# Patient Record
Sex: Male | Born: 1985 | Race: White | Hispanic: No | Marital: Married | Smoking: Former smoker
Health system: Southern US, Community
[De-identification: ages and names within clinical notes are randomized; demographics above are authoritative.]

## PROBLEM LIST (undated history)

## (undated) DIAGNOSIS — K219 Gastro-esophageal reflux disease without esophagitis: Secondary | ICD-10-CM

## (undated) DIAGNOSIS — E669 Obesity, unspecified: Secondary | ICD-10-CM

## (undated) DIAGNOSIS — J36 Peritonsillar abscess: Secondary | ICD-10-CM

## (undated) HISTORY — DX: Obesity, unspecified: E66.9

## (undated) HISTORY — DX: Peritonsillar abscess: J36

## (undated) HISTORY — DX: Gastro-esophageal reflux disease without esophagitis: K21.9

---

## 2012-08-26 ENCOUNTER — Emergency Department: Payer: Self-pay | Admitting: Emergency Medicine

## 2012-08-26 LAB — COMPREHENSIVE METABOLIC PANEL
Albumin: 3.5 g/dL (ref 3.4–5.0)
Alkaline Phosphatase: 93 U/L (ref 50–136)
Anion Gap: 7 (ref 7–16)
BUN: 15 mg/dL (ref 7–18)
Bilirubin,Total: 0.3 mg/dL (ref 0.2–1.0)
Calcium, Total: 8.4 mg/dL — ABNORMAL LOW (ref 8.5–10.1)
Co2: 26 mmol/L (ref 21–32)
Creatinine: 1.03 mg/dL (ref 0.60–1.30)
EGFR (African American): >60
EGFR (Non-African Amer.): >60
Osmolality: 284 (ref 275–301)
Potassium: 3.9 mmol/L (ref 3.5–5.1)
SGOT(AST): 24 U/L (ref 15–37)
SGPT (ALT): 24 U/L (ref 12–78)
Total Protein: 7.5 g/dL (ref 6.4–8.2)

## 2012-08-26 LAB — CBC
HCT: 43.3 %
HGB: 15 g/dL
MCH: 28.5 pg
MCHC: 34.6 g/dL
MCV: 82 fL
Platelet: 188 x10 3/mm 3
RBC: 5.26 x10 6/mm 3
RDW: 13.8 %
WBC: 8.1 x10 3/mm 3

## 2013-02-03 DIAGNOSIS — Z720 Tobacco use: Secondary | ICD-10-CM | POA: Insufficient documentation

## 2014-04-19 ENCOUNTER — Ambulatory Visit: Payer: Self-pay | Admitting: Physician Assistant

## 2014-08-31 ENCOUNTER — Ambulatory Visit
Admission: EM | Admit: 2014-08-31 | Discharge: 2014-08-31 | Disposition: A | Discharge status: Home / Self Care | Payer: BLUE CROSS/BLUE SHIELD | Attending: Internal Medicine | Admitting: Internal Medicine

## 2014-08-31 DIAGNOSIS — H109 Unspecified conjunctivitis: Secondary | ICD-10-CM | POA: Diagnosis not present

## 2014-08-31 MED ORDER — ERYTHROMYCIN 5 MG/GM OP OINT: TOPICAL_OINTMENT | OPHTHALMIC | Status: DC

## 2014-08-31 MED ORDER — OLOPATADINE HCL 0.1 % OP SOLN: 1.0000 [drp] | Freq: Two times a day (BID) | OPHTHALMIC | Status: DC

## 2014-08-31 NOTE — ED Notes (Signed)
Complaints right eye redness since Friday, itchy with foriegn body sensation, discharge, and crusting. Visual Acuity: L 20/40, R 20/30, Both 20/30.

## 2014-08-31 NOTE — ED Provider Notes (Addendum)
CSN: [id number]     Arrival date & time 08/31/14  1612 History   First MD Initiated Contact with Patient 08/31/14 1711     Chief Complaint  Patient presents with  . Conjunctivitis  HPI  Patient is a 29 year old gentleman with no significant past medical history. He presents with a three-day history of foreign body sensation, redness, irritation, in the right eye. No loss of vision. Minimal crusting around the eye in the mornings. No runny/congested nose, not coughing. Some headache. Not waking in the night with pain in the eye. No fever.   No past medical history on file. No past surgical history on file. No family history on file. History  Substance Use Topics  . Smoking status: Never Smoker   . Smokeless tobacco: Not on file  . Alcohol Use: No    Review of Systems  All other systems reviewed and are negative.   Allergies  Review of patient's allergies indicates no known allergies.  Home Medications  Pt takes no meds regularly    BP 127/77 mmHg  Pulse 88  Temp(Src) 97.5 F (36.4 C) (Tympanic)  Resp 16  SpO2 96% Physical Exam  Constitutional: He is oriented to person, place, and time. No distress.  Alert, nicely groomed  HENT:  Head: Atraumatic.  Eyes:  Right eye conjunctiva is moderately injected, right upper lid is mildly erythematous, slightly puffy, no pointing. Right upper lid is everted, and no foreign body is visualized. No corneal irregularity is appreciated on penlight exam, nor with fluorescein exam.   Pupils are equal round and react to light. EOMI. No hypopyon  Neck: Neck supple.  Cardiovascular: Normal rate.   Pulmonary/Chest: No respiratory distress.  Abdominal: Soft. He exhibits no distension.  Musculoskeletal: Normal range of motion.  Neurological: He is alert and oriented to person, place, and time.  Skin: Skin is warm and dry.  No cyanosis  Nursing note and vitals reviewed.    Visual Acuity  Right Eye Distance: 20/40 Left Eye  Distance: 20/30 Bilateral Distance: 20/30    ED Course  Procedures  None at the urgent care today. MDM   1. Conjunctivitis, right eye    Prescription for olopatadine eyedrops, erythromycin eye ointment. Follow-up with eye doctor if not improving in 1-2 days. Office number for Candice CampCynthia Donnelly was given, [facility].    Eustace MooreLaura W Tanor Glaspy, MD 09/02/14 [phone]  Eustace MooreLaura W Evvie Behrmann, MD 09/02/14 [phone]

## 2014-08-31 NOTE — Discharge Instructions (Signed)
If not improving in 1-2 days (redness, irritation) have eye rechecked with eye doctor to rule out other causes of a red eye.  Dr Reece Leaderonnelly at [facility] [place] in [place].  Conjunctivitis Conjunctivitis is commonly called "pink eye." Conjunctivitis can be caused by bacterial or viral infection, allergies, or injuries. There is usually redness of the lining of the eye, itching, discomfort, and sometimes discharge. There may be deposits of matter along the eyelids. A viral infection usually causes a watery discharge, while a bacterial infection causes a yellowish, thick discharge. Pink eye is very contagious and spreads by direct contact. You may be given antibiotic eyedrops as part of your treatment. Before using your eye medicine, remove all drainage from the eye by washing gently with warm water and cotton balls. Continue to use the medication until you have awakened 2 mornings in a row without discharge from the eye. Do not rub your eye. This increases the irritation and helps spread infection. Use separate towels from other household members. Wash your hands with soap and water before and after touching your eyes. Use cold compresses to reduce pain and sunglasses to relieve irritation from light. Do not wear contact lenses or wear eye makeup until the infection is gone. SEEK MEDICAL CARE IF:   Your symptoms are not better after 3 days of treatment.  You have increased pain or trouble seeing.  The outer eyelids become very red or swollen. Document Released: 03/16/2004 Document Revised: 05/01/2011 Document Reviewed: 02/06/2005 [facility] Patient Information 2015 [place]. This information is not intended to replace advice given to you by your health care provider. Make sure you discuss any questions you have with your health care provider.

## 2014-12-31 ENCOUNTER — Ambulatory Visit
Admission: EM | Admit: 2014-12-31 | Discharge: 2014-12-31 | Disposition: A | Discharge status: Home / Self Care | Payer: BLUE CROSS/BLUE SHIELD | Attending: Family Medicine | Admitting: Family Medicine

## 2014-12-31 ENCOUNTER — Encounter: Payer: Self-pay | Admitting: Emergency Medicine

## 2014-12-31 DIAGNOSIS — J029 Acute pharyngitis, unspecified: Secondary | ICD-10-CM

## 2014-12-31 DIAGNOSIS — J020 Streptococcal pharyngitis: Secondary | ICD-10-CM | POA: Diagnosis not present

## 2014-12-31 LAB — RAPID STREP SCREEN (MED CTR MEBANE ONLY): STREPTOCOCCUS, GROUP A SCREEN (DIRECT): POSITIVE — AB

## 2014-12-31 MED ORDER — PENICILLIN G BENZATHINE 1200000 UNIT/2ML IM SUSP
1.2000 10*6.[IU] | Freq: Once | INTRAMUSCULAR | Status: AC
  Administered 2014-12-31: 1.2 10*6.[IU] via INTRAMUSCULAR

## 2014-12-31 NOTE — ED Provider Notes (Signed)
CSN: [id number]     Arrival date & time 12/31/14  [id number] History   First MD Initiated Contact with Patient 12/31/14 0920    Nurses notes were reviewed. Chief Complaint  Patient presents with  . Sore Throat   patient with sore throat for several days. States she's had about 3-4 episodes of strep this year. Because of throat pain worse not better and came in to be seen and evaluated. States it was Injection really Bicillin seemed to help the best.   (Consider location/radiation/quality/duration/timing/severity/associated sxs/prior Treatment) Patient is a 29 y.o. male presenting with pharyngitis. The history is provided by the patient. No language interpreter was used.  Sore Throat This is a new problem. The current episode started more than 2 days ago. The problem occurs constantly. The problem has been gradually worsening. Pertinent negatives include no chest pain, no abdominal pain and no headaches. Nothing relieves the symptoms. He has tried nothing for the symptoms. The treatment provided no relief.    History reviewed. No pertinent past medical history. History reviewed. No pertinent past surgical history. History reviewed. No pertinent family history. Social History  Substance Use Topics  . Smoking status: Current Every Day Smoker -- 1.00 packs/day    Types: Cigarettes  . Smokeless tobacco: None  . Alcohol Use: None    Review of Systems  Cardiovascular: Negative for chest pain.  Gastrointestinal: Negative for abdominal pain.  Neurological: Negative for headaches.  All other systems reviewed and are negative.   Allergies  Review of patient's allergies indicates no known allergies.  Home Medications   Prior to Admission medications   Medication Sig Start Date End Date Taking? Authorizing Provider  erythromycin ophthalmic ointment Place a 1/2 inch ribbon of ointment into the lower eyelid. 08/31/14   Eustace MooreLaura W Murray, MD  olopatadine (PATANOL) 0.1 % ophthalmic solution Place 1  drop into the right eye 2 (two) times daily. 08/31/14   Eustace MooreLaura W Murray, MD   Meds Ordered and Administered this Visit   Medications  penicillin g benzathine (BICILLIN LA) 1200000 UNIT/2ML injection 1.2 Million Units (not administered)    BP 124/80 mmHg  Pulse 103  Temp(Src) 99.9 F (37.7 C) (Tympanic)  Resp 20  Ht 6' (1.829 m)  Wt 302 lb (136.986 kg)  BMI 40.95 kg/m2  SpO2 97% No data found.   Physical Exam  Constitutional: He is oriented to person, place, and time. He appears well-developed and well-nourished.  Obese white male  Eyes: Pupils are equal, round, and reactive to light.  Neck: Normal range of motion. Neck supple. No tracheal deviation present.  Musculoskeletal: Normal range of motion.  Lymphadenopathy:    He has cervical adenopathy.  Neurological: He is alert and oriented to person, place, and time. No cranial nerve deficit.  Skin: Skin is dry. No erythema.  Psychiatric: He has a normal mood and affect.  Vitals reviewed.   ED Course  Procedures (including critical care time)  Labs Review Labs Reviewed  RAPID STREP SCREEN (NOT AT [facility]) - Abnormal; Notable for the following:    Streptococcus, Group A Screen (Direct) POSITIVE (*)    All other components within normal limits    Imaging Review No results found.   Visual Acuity Review  Right Eye Distance:   Left Eye Distance:   Bilateral Distance:    Right Eye Near:   Left Eye Near:    Bilateral Near:         MDM   1. Strep pharyngitis   2.  Pharyngitis    Patient will be given injection of LA Bicillin 1.2 million units. Recommend follow-up in 2 weeks either here or with his PCP for repeat strep test for procedure care. Work note written for today and tomorrow.    Hassan Rowan, MD 12/31/14 [phone]

## 2014-12-31 NOTE — ED Notes (Signed)
Started with sore throat on Tuesday, is progressively getting worse, ears hurt as well

## 2014-12-31 NOTE — Discharge Instructions (Signed)
Strep Throat Strep throat is an infection of the throat. It is caused by germs. Strep throat spreads from person to person because of coughing, sneezing, or close contact. HOME CARE Medicines  Take over-the-counter and prescription medicines only as told by your doctor.  Take your antibiotic medicine as told by your doctor. Do not stop taking the medicine even if you feel better.  Have family members who also have a sore throat or fever go to a doctor. Eating and Drinking  Do not share food, drinking cups, or personal items.  Try eating soft foods until your sore throat feels better.  Drink enough fluid to keep your pee (urine) clear or pale yellow. General Instructions  Rinse your mouth (gargle) with a salt-water mixture 3-4 times per day or as needed. To make a salt-water mixture, stir -1 tsp of salt into 1 cup of warm water.  Make sure that all people in your house wash their hands well.  Rest.  Stay home from school or work until you have been taking antibiotics for 24 hours.  Keep all follow-up visits as told by your doctor. This is important. GET HELP IF:  Your neck keeps getting bigger.  You get a rash, cough, or earache.  You cough up thick liquid that is green, yellow-brown, or bloody.  You have pain that does not get better with medicine.  Your problems get worse instead of getting better.  You have a fever. GET HELP RIGHT AWAY IF:  You throw up (vomit).  You get a very bad headache.  You neck hurts or it feels stiff.  You have chest pain or you are short of breath.  You have drooling, very bad throat pain, or changes in your voice.  Your neck is swollen or the skin gets red and tender.  Your mouth is dry or you are peeing less than normal.  You keep feeling more tired or it is hard to wake up.  Your joints are red or they hurt.   This information is not intended to replace advice given to you by your health care provider. Make sure you  discuss any questions you have with your health care provider.   Document Released: 07/26/2007 Document Revised: 10/28/2014 Document Reviewed: 06/01/2014 Elsevier Interactive Patient Education 2016 Elsevier Inc.  Upper Respiratory Infection, Adult Most upper respiratory infections (URIs) are caused by a virus. A URI affects the nose, throat, and upper air passages. The most common type of URI is often called "the common cold." HOME CARE   Take medicines only as told by your doctor.  Gargle warm saltwater or take cough drops to comfort your throat as told by your doctor.  Use a warm mist humidifier or inhale steam from a shower to increase air moisture. This may make it easier to breathe.  Drink enough fluid to keep your pee (urine) clear or pale yellow.  Eat soups and other clear broths.  Have a healthy diet.  Rest as needed.  Go back to work when your fever is gone or your doctor says it is okay.  You may need to stay home longer to avoid giving your URI to others.  You can also wear a face mask and wash your hands often to prevent spread of the virus.  Use your inhaler more if you have asthma.  Do not use any tobacco products, including cigarettes, chewing tobacco, or electronic cigarettes. If you need help quitting, ask your doctor. GET HELP IF:  You are  getting worse, not better.  Your symptoms are not helped by medicine.  You have chills.  You are getting more short of breath.  You have brown or red mucus.  You have yellow or brown discharge from your nose.  You have pain in your face, especially when you bend forward.  You have a fever.  You have puffy (swollen) neck glands.  You have pain while swallowing.  You have white areas in the back of your throat. GET HELP RIGHT AWAY IF:   You have very bad or constant:  Headache.  Ear pain.  Pain in your forehead, behind your eyes, and over your cheekbones (sinus pain).  Chest pain.  You have  long-lasting (chronic) lung disease and any of the following:  Wheezing.  Long-lasting cough.  Coughing up blood.  A change in your usual mucus.  You have a stiff neck.  You have changes in your:  Vision.  Hearing.  Thinking.  Mood. MAKE SURE YOU:   Understand these instructions.  Will watch your condition.  Will get help right away if you are not doing well or get worse.   This information is not intended to replace advice given to you by your health care provider. Make sure you discuss any questions you have with your health care provider.   Document Released: 07/26/2007 Document Revised: 06/23/2014 Document Reviewed: 05/14/2013 Elsevier Interactive Patient Education [employer] Elsevier Inc.

## 2015-01-10 ENCOUNTER — Emergency Department
Admission: EM | Admit: 2015-01-10 | Discharge: 2015-01-11 | Disposition: A | Discharge status: Home / Self Care | Payer: BLUE CROSS/BLUE SHIELD | Attending: Emergency Medicine | Admitting: Emergency Medicine

## 2015-01-10 ENCOUNTER — Encounter: Payer: Self-pay | Admitting: Emergency Medicine

## 2015-01-10 DIAGNOSIS — J36 Peritonsillar abscess: Secondary | ICD-10-CM

## 2015-01-10 DIAGNOSIS — Z792 Long term (current) use of antibiotics: Secondary | ICD-10-CM | POA: Diagnosis not present

## 2015-01-10 DIAGNOSIS — H9201 Otalgia, right ear: Secondary | ICD-10-CM | POA: Diagnosis not present

## 2015-01-10 DIAGNOSIS — F17210 Nicotine dependence, cigarettes, uncomplicated: Secondary | ICD-10-CM | POA: Diagnosis not present

## 2015-01-10 DIAGNOSIS — J029 Acute pharyngitis, unspecified: Secondary | ICD-10-CM | POA: Diagnosis present

## 2015-01-10 HISTORY — DX: Peritonsillar abscess: J36

## 2015-01-10 MED ORDER — BENZOCAINE 20 % MT SOLN
Freq: Once | OROMUCOSAL | Status: AC
  Administered 2015-01-10: 1 via OROMUCOSAL
  Filled 2015-01-10: qty 10

## 2015-01-10 MED ORDER — CLINDAMYCIN HCL 150 MG PO CAPS
300.0000 mg | ORAL_CAPSULE | Freq: Once | ORAL | Status: AC
  Administered 2015-01-11: 300 mg via ORAL
  Filled 2015-01-10: qty 2

## 2015-01-10 MED ORDER — DEXAMETHASONE SODIUM PHOSPHATE 10 MG/ML IJ SOLN
10.0000 mg | Freq: Once | INTRAMUSCULAR | Status: AC
  Administered 2015-01-10: 10 mg via INTRAVENOUS
  Filled 2015-01-10: qty 1

## 2015-01-10 MED ORDER — OXYCODONE-ACETAMINOPHEN 5-325 MG PO TABS: 1.0000 | ORAL_TABLET | Freq: Four times a day (QID) | ORAL | Status: DC | PRN

## 2015-01-10 MED ORDER — CLINDAMYCIN HCL 300 MG PO CAPS: 300.0000 mg | ORAL_CAPSULE | Freq: Three times a day (TID) | ORAL | Status: DC

## 2015-01-10 MED ORDER — MORPHINE SULFATE (PF) 2 MG/ML IV SOLN
2.0000 mg | Freq: Once | INTRAVENOUS | Status: AC
  Administered 2015-01-10: 2 mg via INTRAVENOUS
  Filled 2015-01-10: qty 1

## 2015-01-10 MED ORDER — OXYCODONE-ACETAMINOPHEN 5-325 MG PO TABS
2.0000 | ORAL_TABLET | Freq: Once | ORAL | Status: AC
  Administered 2015-01-11: 2 via ORAL
  Filled 2015-01-10: qty 2

## 2015-01-10 MED ORDER — LIDOCAINE-EPINEPHRINE 1 %-1:200000 IJ SOLN
INTRAMUSCULAR | Status: AC
Start: 2015-01-10 — End: 2015-01-10
  Administered 2015-01-10: 30 mL
  Filled 2015-01-10: qty 30

## 2015-01-10 MED ORDER — LIDOCAINE-EPINEPHRINE (PF) 2 %-1:200000 IJ SOLN
10.0000 mL | Freq: Once | INTRAMUSCULAR | Status: AC
  Filled 2015-01-10: qty 10

## 2015-01-10 NOTE — Consult Note (Signed)
..   Howard Bauer, Howard Bauer [id number] 1985-06-02 [name]  Reason for Consult: Peritonsillar abscess  HPI: 29 y.o. Male with a ten day history of strep throat and pain.  Seen initially and given PCN injection.  Continued to have pain which has persisted and worsened on his right side.  Began having pain tolerating his secretions and right sided neck and ear pain as well.  Presented to ED and found to have probable right sided peritonsillar abscess.  Patient reports 2 or 3 prior strep throat infections this past year.  Patient denies breathing difficulty.  Allergies: No Known Allergies  ROS: Review of systems normal other than 12 systems except per HPI.  PMH: History reviewed. No pertinent past medical history.  FH: No family history on file.  SH:  Social History   Social History  . Marital Status: Married    Spouse Name: N/A  . Number of Children: N/A  . Years of Education: N/A   Occupational History  . Not on file.   Social History Main Topics  . Smoking status: Current Every Day Smoker -- 1.00 packs/day    Types: Cigarettes  . Smokeless tobacco: Not on file  . Alcohol Use: No  . Drug Use: No  . Sexual Activity: Not on file   Other Topics Concern  . Not on file   Social History Narrative    PSH: History reviewed. No pertinent past surgical history.  Physical  Exam: GEN-  CN 2-12 grossly intact and symmetric. EARS- external ears normal BL OC/OP-  Significant right tonsillar swelling with purulence and exudate, uvular deviation to left side EXT- Skin warm and dry NOSE- Nasal cavity without polyps or purulence. External nose without masses or lesions NECK- neck supple with no masses or lesions. Bilateral lymphadenopathy palpated. Thyroid normal with no masses.  Procedure-  Drainage of right peritonsillar abscess with ~10cc's purulent material removed.  Please see separate operative report for details   A/P: Peritonsillar abscess s/p drainage  Plan:  IV steroids and  abx.  Follow up early this week if no significant improvement.  If patient is able to tolerate PO, he is cleared for discharge.   Howard Bauer 01/10/2015 11:48 PM

## 2015-01-10 NOTE — ED Provider Notes (Signed)
[facility] Emergency Department Provider Note  Time seen: 10:55 PM  I have reviewed the triage vital signs and the nursing notes.   HISTORY  Chief Complaint Sore Throat and Otalgia    HPI Howard Bauer is a 29 y.o. male with no past medical history who presents the emergency department with throat pain. According to the patient he was diagnosed with strep throat on 12/31/14 and dosed Bicillin. He states since that time his sore throat has never completely resolved but over the past 3-4 days it has worsened significantly with significant pain with swallowing. Denies fever, nausea, vomiting. Patient is able to swallow but with  pain.     History reviewed. No pertinent past medical history.  There are no active problems to display for this patient.   History reviewed. No pertinent past surgical history.  Current Outpatient Rx  Name  Route  Sig  Dispense  Refill  . erythromycin ophthalmic ointment      Place a 1/2 inch ribbon of ointment into the lower eyelid.   3.5 g   0   . olopatadine (PATANOL) 0.1 % ophthalmic solution   Right Eye   Place 1 drop into the right eye 2 (two) times daily.   5 mL   0     Allergies Review of patient's allergies indicates no known allergies.  No family history on file.  Social History Social History  Substance Use Topics  . Smoking status: Current Every Day Smoker -- 1.00 packs/day    Types: Cigarettes  . Smokeless tobacco: None  . Alcohol Use: No    Review of Systems Constitutional: Negative for fever. ENT:  Positive sore throat Cardiovascular: Negative for chest pain. Respiratory: Negative for shortness of breath. Gastrointestinal: Negative for abdominal pain Genitourinary: Negative for dysuria. Musculoskeletal: Negative for back pain. Neurological: Negative for headache 10-point ROS otherwise negative.  ____________________________________________   PHYSICAL EXAM:  VITAL SIGNS: ED  Triage Vitals  Enc Vitals Group     BP 01/10/15 2150 130/85 mmHg     Pulse Rate 01/10/15 2150 84     Resp 01/10/15 2150 18     Temp 01/10/15 2150 98.2 F (36.8 C)     Temp Source 01/10/15 2150 Oral     SpO2 01/10/15 2150 94 %     Weight 01/10/15 2150 295 lb (133.811 kg)     Height 01/10/15 2150 6' (1.829 m)     Head Cir --      Peak Flow --      Pain Score 01/10/15 2151 10     Pain Loc --      Pain Edu? --      Excl. in GC? --     Constitutional: Alert and oriented. Well appearing and in no distress. Eyes: Normal exam ENT   Head: Normocephalic and atraumatic   Mouth/Throat: Mucous membranes are moist. significant swelling of the right tonsil, with leftward uvular deviation most consistent with a right peritonsillar abscess. Exudate present bilaterally, more so on the right tonsil. Cardiovascular: Normal rate, regular rhythm. No murmur Respiratory: Normal respiratory effort without tachypnea nor retractions. Breath sounds are clear  Gastrointestinal: Soft and nontender. No distention.  Musculoskeletal: Nontender with normal range of motion in all extremities. Neurologic:  Normal speech and language. No gross focal neurologic deficits Skin:  Skin is warm, dry and intact.  Psychiatric: Mood and affect are normal. Speech and behavior are normal.   ____________________________________________   INITIAL IMPRESSION / ASSESSMENT AND  PLAN / ED COURSE  Pertinent labs & imaging results that were available during my care of the patient were reviewed by me and considered in my medical decision making (see chart for details).  Patient presents with sore throat, exam most consistent with a right peritonsillar abscess. I discussed the patient with ENT, Dr. Andee Poles is currently in the emergency department evaluating the patient.  Dr. Andee Poles has drained the peritonsillar abscess in the emergency department. We'll discharge home on Percocet, clindamycin. Patient was dose morphine and  Decadron in the emergency department. ____________________________________________   FINAL CLINICAL IMPRESSION(S) / ED DIAGNOSES   right peritonsillar abscess   Minna Antis, MD 01/10/15 2326

## 2015-01-10 NOTE — Op Note (Signed)
..  01/10/2015 11:54 PM    Gentry Fitzapps, Alex[place]ander  [id number]   Pre-Op Dx:  Right Peritonsillar Abscess  Post-op Dx: same  Proc:  I&D Right sided PTA  Surg:  Valda Christenson  Anes:  Local  EBL:  <10  Comp:  none  Findings:  Large right sided abscess cavity opened and drained  Procedure: With the patient in a comfortable sitting position, local Hurricane was administered in standard fashion.  After this, 2cc's of 1% Lidocaine with 1:100,000 Epi was injected into the surrounding tissues of his right tonsil and given several minutes to take effect.  At this time, an 1418 gauge seeker needle was placed on a 10cc syringe and inserted just lateral and superior to the right tonsil.  Approximately 10ccs of purulence was removed.  At this time, an 11 blade scalpel was used to make a small stab incision into the abscess pocket.  Kelly clamp was used to spread this incision open and enter the abscess cavity.  Purulent and bloody secretions were suctioned.  A significant reduction in size of the right tonsillar swelling was noted.   At this time, the procedure was complete and the patient was transferred to care of the ER in good condition.    Dispo:   Home  Plan:  Hydration, antibiosis, analgesia   Gauri Galvao 01/10/2015 11:54 PM

## 2015-01-10 NOTE — Discharge Instructions (Signed)
Peritonsillar Abscess °A peritonsillar abscess is a collection of yellowish-white fluid (pus) in the back of the throat behind the tonsils. It usually occurs when an infection of the throat or tonsils (tonsillitis) spreads into the tissues around the tonsils. °CAUSES °The infection that leads to a peritonsillar abscess is usually caused by streptococcal bacteria.  °SIGNS AND SYMPTOMS °· Sore throat, often with pain on just one side. °· Swelling and tenderness of the glands (lymph nodes) in the neck. °· Difficulty swallowing. °· Difficulty opening your mouth. °· Fever. °· Chills. °· Drooling because of difficulty swallowing saliva. °· Headache. °· Changes in your voice. °· Bad breath. °DIAGNOSIS °Your health care provider will take your medical history and do a physical exam. Imaging tests may be done, such as an ultrasound or CT scan. A sample of pus may be removed from the abscess using a needle (needle aspiration) or by swabbing the back of your throat. This sample will be sent to a lab for testing. °TREATMENT °Treatment usually involves draining the pus from the abscess. This may be done through needle aspiration or by making an incision in the abscess. You will also likely need to take antibiotic medicine. °HOME CARE INSTRUCTIONS °· Rest as much as possible and get plenty of sleep. °· Take medicines only as directed by your health care provider. °· If you were prescribed an antibiotic medicine, finish it all even if you start to feel better. °· If your abscess was drained by your health care provider, gargle with a mixture of salt and warm water: °¨ Mix 1 tsp of salt in 8 oz of warm water. °¨ Gargle with this mixture four times per day or as needed for comfort. °¨ Do not swallow this mixture. °· Drink plenty of fluids. °· While your throat is sore, eat soft or liquid foods, such as frozen ice pops and ice cream. °· Keep all follow-up visits as directed by your health care provider. This is important. °SEEK  MEDICAL CARE IF: °· You have increased pain, swelling, redness, or drainage in your throat. °· You develop a headache, a lack of energy (lethargy), or generalized feelings of illness. °· You have a fever. °· You feel dizzy. °· You have difficulty swallowing or eating. °· You show signs of becoming dehydrated, such as: °¨ Light-headedness when standing. °¨ Decreased urine output. °¨ A fast heart rate. °¨ Dry mouth. °SEEK IMMEDIATE MEDICAL CARE IF:  °· You have difficulty talking or breathing, or you find it easier to breathe when you lean forward. °· You are coughing up blood or vomiting blood. °· You have severe throat pain that is not helped by medicines. °· You start to drool. °  °This information is not intended to replace advice given to you by your health care provider. Make sure you discuss any questions you have with your health care provider. °  °Document Released: 02/06/2005 Document Revised: 02/27/2014 Document Reviewed: 09/22/2013 °Elsevier Interactive Patient Education ©2016 Elsevier Inc. ° °

## 2015-01-10 NOTE — ED Notes (Signed)
Pt presents to ED with c/o sore throat and right ear pain since 01/07/15. Pt reports was dx with strep throat Nov 10 and given a penicillin injection. Pt reports intermittent right ear pain with ringing at times. Pt c/o painful to swallow. Pt denies shortness of breath, abdominal pain, or chest pain. Pt alert and oriented x 4, no increased work in breathing noted, skin warm and dry. Family at bedside, call bell within reach.

## 2015-01-10 NOTE — ED Notes (Signed)
Pt c/o sore throat on the right side with right earache

## 2015-01-10 NOTE — ED Notes (Signed)
Dr. Vaught at bedside. 

## 2015-01-10 NOTE — ED Notes (Signed)
Pt complains of sore throat and pain to right ear that started on Thursday.

## 2015-01-11 NOTE — ED Notes (Signed)

## 2015-02-09 ENCOUNTER — Ambulatory Visit (INDEPENDENT_AMBULATORY_CARE_PROVIDER_SITE_OTHER): Payer: BLUE CROSS/BLUE SHIELD | Admitting: Family Medicine

## 2015-02-09 ENCOUNTER — Encounter: Payer: Self-pay | Admitting: Family Medicine

## 2015-02-09 VITALS — BP 129/84 | HR 85 | Temp 98.1°F | Resp 16 | Ht 72.0 in | Wt 303.0 lb

## 2015-02-09 DIAGNOSIS — Z8249 Family history of ischemic heart disease and other diseases of the circulatory system: Secondary | ICD-10-CM

## 2015-02-09 DIAGNOSIS — Z23 Encounter for immunization: Secondary | ICD-10-CM

## 2015-02-09 DIAGNOSIS — Z720 Tobacco use: Secondary | ICD-10-CM | POA: Diagnosis not present

## 2015-02-09 DIAGNOSIS — Z0000 Encounter for general adult medical examination without abnormal findings: Secondary | ICD-10-CM | POA: Diagnosis not present

## 2015-02-09 DIAGNOSIS — Z833 Family history of diabetes mellitus: Secondary | ICD-10-CM

## 2015-02-09 MED ORDER — BUPROPION HCL ER (SR) 150 MG PO TB12: 150.0000 mg | ORAL_TABLET | Freq: Two times a day (BID) | ORAL | Status: DC

## 2015-02-09 NOTE — Assessment & Plan Note (Signed)
Pt desiring to quit smoking. Discussed risks and benefits of Wellbutrin for smoking cessation. Pt has not contraindications.  Start buproprion SR BID for 12 weeks.  RTC 3 mos for smoking cessation.  >10 minutes tobacco counseling given at this visit.

## 2015-02-09 NOTE — Progress Notes (Signed)
Subjective:    Patient ID: Howard Bauer, male    DOB: 1986-02-16, 29 y.o.   MRN: [id number]  HPI: Howard Bauer is a 29 y.o. male presenting on 02/09/2015 for Establish Care   HPI  Pt presents to establish care today. Previous care provider was Dr. Yetta Barre, [facility]  It has been 5 years since His last PCP visit. Records from previous provider will be requested and reviewed. Current medical problems include:  Recent peritonsillar abcess. Treated 1 mos ago. Per patient doesn't need follow with ENT unless he gets strep throat again.   Health maintenance:  Last Tdap- 10 years ago. Would like updated today.  Works at [employer] x 10 years. Smoke exposure. Smoker- Tried chantix in the past- bad dreams. Is desiring to quit smoking.  Strong family history of heart disease and diabetes. Will screen today.       Past Medical History  Diagnosis Date  . Peritonsillar abscess . GERD (gastroesophageal reflux disease)     in past  . Obesity    Social History   Social History  . Marital Status: Married    Spouse Name: N/A  . Number of Children: N/A  . Years of Education: N/A   Occupational History  . Not on file.   Social History Main Topics  . Smoking status: Current Every Day Smoker -- 1.00 packs/day    Types: Cigarettes  . Smokeless tobacco: Not on file  . Alcohol Use: No  . Drug Use: No  . Sexual Activity: Not on file   Other Topics Concern  . Not on file   Social History Narrative   Family History  Problem Relation Age of Onset  . Heart disease Mother   . Cancer Mother     ovarian cancer  . Heart disease Father   . Stroke Father   . Diabetes Sister     type 1  . Cancer Maternal Grandmother     liver cancer   No current outpatient prescriptions on file prior to visit.   No current facility-administered medications on file prior to visit.    Review of Systems  Constitutional: Negative for fever and chills.  HENT:  Negative.   Respiratory: Negative for chest tightness, shortness of breath and wheezing.   Cardiovascular: Negative for chest pain, palpitations and leg swelling.  Gastrointestinal: Negative for nausea, vomiting and abdominal pain.  Endocrine: Negative.   Genitourinary: Negative for dysuria, urgency, discharge, penile pain and testicular pain.  Musculoskeletal: Negative for back pain, joint swelling and arthralgias.  Skin: Negative.   Neurological: Negative for dizziness, weakness, numbness and headaches.  Psychiatric/Behavioral: Negative for sleep disturbance and dysphoric mood.   Per HPI unless specifically indicated above     Objective:    BP 129/84 mmHg  Pulse 85  Temp(Src) 98.1 F (36.7 C) (Oral)  Resp 16  Ht 6' (1.829 m)  Wt 303 lb (137.44 kg)  BMI 41.09 kg/m2  Wt Readings from Last 3 Encounters:  02/09/15 303 lb (137.44 kg)  01/10/15 295 lb (133.811 kg)  12/31/14 302 lb (136.986 kg)    Physical Exam  Constitutional: He is oriented to person, place, and time. He appears well-developed and well-nourished. No distress.  HENT:  Head: Normocephalic and atraumatic.  Neck: Neck supple. No thyromegaly present.  Cardiovascular: Normal rate, regular rhythm and normal heart sounds.  Exam reveals no gallop and no friction rub.   No murmur heard. Pulmonary/Chest: Effort normal and breath  sounds normal. He has no wheezes.  Abdominal: Soft. Bowel sounds are normal. He exhibits no distension. There is no tenderness. There is no rebound.  Musculoskeletal: Normal range of motion. He exhibits no edema or tenderness.  Neurological: He is alert and oriented to person, place, and time. He has normal reflexes.  Skin: Skin is warm and dry. No rash noted. No erythema.  Psychiatric: He has a normal mood and affect. His behavior is normal. Thought content normal.   Results for orders placed or performed during the hospital encounter of 12/31/14  Rapid strep screen  Result Value Ref Range    Streptococcus, Group A Screen (Direct) POSITIVE (A) NEGATIVE      Assessment & Plan:   Problem List Items Addressed This Visit      Other   Current tobacco use    Pt desiring to quit smoking. Discussed risks and benefits of Wellbutrin for smoking cessation. Pt has not contraindications.  Start buproprion SR BID for 12 weeks.  RTC 3 mos for smoking cessation.  >10 minutes tobacco counseling given at this visit.       Relevant Medications   buPROPion (WELLBUTRIN SR) 150 MG 12 hr tablet    Other Visit Diagnoses    Preventative health care    -  Primary    Relevant Orders    Tdap vaccine greater than or equal to 7yo IM (Completed)    Family history of heart disease        Check lipid panel to stratify risk.     Relevant Orders    Lipid Profile    Family history of diabetes mellitus        Check CMP to stratify risk for DM.     Relevant Orders    Comprehensive Metabolic Panel (CMET)       Meds ordered this encounter  Medications  . buPROPion (WELLBUTRIN SR) 150 MG 12 hr tablet    Sig: Take 1 tablet (150 mg total) by mouth 2 (two) times daily.    Dispense:  60 tablet    Refill:  2    Order Specific Question:  Supervising Provider    Answer:  Janeann ForehandHAWKINS JR, JAMES H [[id number]]      Follow up plan: Return in about 3 months (around 05/10/2015) for Smoking. .Marland Kitchen

## 2015-02-09 NOTE — Patient Instructions (Addendum)
Start taking 150 SR of Wellbutrin at night x 7 days for smoking then increase to 150 twice daily in the second week.  We will try this for 12 weeks.   Bupropion sustained-release tablets (Depression/Mood Disorders) What is this medicine? BUPROPION (byoo PROE pee on) is used to treat depression. This medicine may be used for other purposes; ask your health care provider or pharmacist if you have questions. What should I tell my health care provider before I take this medicine? They need to know if you have any of these conditions: -an eating disorder, such as anorexia or bulimia -bipolar disorder or psychosis -diabetes or high blood sugar, treated with medication -glaucoma -head injury or brain tumor -heart disease, previous heart attack, or irregular heart beat -high blood pressure -kidney or liver disease -seizures -suicidal thoughts or a previous suicide attempt -Tourette's syndrome -weight loss -an unusual or allergic reaction to bupropion, other medicines, foods, dyes, or preservatives -breast-feeding -pregnant or trying to become pregnant How should I use this medicine? Take this medicine by mouth with a glass of water. Follow the directions on the prescription label. You can take it with or without food. If it upsets your stomach, take it with food. Do not cut, crush or chew this medicine. Take your medicine at regular intervals. If you take this medicine more than once a day, take your second dose at least 8 hours after you take your first dose. To limit difficulty in sleeping, avoid taking this medicine at bedtime. Do not take your medicine more often than directed. Do not stop taking this medicine suddenly except upon the advice of your doctor. Stopping this medicine too quickly may cause serious side effects or your condition may worsen. A special MedGuide will be given to you by the pharmacist with each prescription and refill. Be sure to read this information carefully each  time. Talk to your pediatrician regarding the use of this medicine in children. Special care may be needed. Overdosage: If you think you have taken too much of this medicine contact a poison control center or emergency room at once. NOTE: This medicine is only for you. Do not share this medicine with others. What if I miss a dose? If you miss a dose, skip the missed dose and take your next tablet at the regular time. There should be at least 8 hours between doses. Do not take double or extra doses. What may interact with this medicine? Do not take this medicine with any of the following medications: -linezolid -MAOIs like Azilect, Carbex, Eldepryl, Marplan, Nardil, and Parnate -methylene blue (injected into a vein) -other medicines that contain bupropion like Zyban This medicine may also interact with the following medications: -alcohol -certain medicines for anxiety or sleep -certain medicines for blood pressure like metoprolol, propranolol -certain medicines for depression or psychotic disturbances -certain medicines for HIV or AIDS like efavirenz, lopinavir, nelfinavir, ritonavir -certain medicines for irregular heart beat like propafenone, flecainide -certain medicines for Parkinson's disease like amantadine, levodopa -certain medicines for seizures like carbamazepine, phenytoin, phenobarbital -cimetidine -clopidogrel -cyclophosphamide -furazolidone -isoniazid -nicotine -orphenadrine -procarbazine -steroid medicines like prednisone or cortisone -stimulant medicines for attention disorders, weight loss, or to stay awake -tamoxifen -theophylline -thiotepa -ticlopidine -tramadol -warfarin This list may not describe all possible interactions. Give your health care provider a list of all the medicines, herbs, non-prescription drugs, or dietary supplements you use. Also tell them if you smoke, drink alcohol, or use illegal drugs. Some items may interact with your medicine. What  should I watch for while using this medicine? Tell your doctor if your symptoms do not get better or if they get worse. Visit your doctor or health care professional for regular checks on your progress. Because it may take several weeks to see the full effects of this medicine, it is important to continue your treatment as prescribed by your doctor. Patients and their families should watch out for new or worsening thoughts of suicide or depression. Also watch out for sudden changes in feelings such as feeling anxious, agitated, panicky, irritable, hostile, aggressive, impulsive, severely restless, overly excited and hyperactive, or not being able to sleep. If this happens, especially at the beginning of treatment or after a change in dose, call your health care professional. Avoid alcoholic drinks while taking this medicine. Drinking excessive alcoholic beverages, using sleeping or anxiety medicines, or quickly stopping the use of these agents while taking this medicine may increase your risk for a seizure. Do not drive or use heavy machinery until you know how this medicine affects you. This medicine can impair your ability to perform these tasks. Do not take this medicine close to bedtime. It may prevent you from sleeping. Your mouth may get dry. Chewing sugarless gum or sucking hard candy, and drinking plenty of water may help. Contact your doctor if the problem does not go away or is severe. What side effects may I notice from receiving this medicine? Side effects that you should report to your doctor or health care professional as soon as possible: -allergic reactions like skin rash, itching or hives, swelling of the face, lips, or tongue -breathing problems -changes in vision -confusion -fast or irregular heartbeat -hallucinations -increased blood pressure -redness, blistering, peeling or loosening of the skin, including inside the mouth -seizures -suicidal thoughts or other mood  changes -unusually weak or tired -vomiting Side effects that usually do not require medical attention (report to your doctor or health care professional if they continue or are bothersome): -change in sex drive or performance -constipation -headache -loss of appetite -nausea -tremors -weight loss This list may not describe all possible side effects. Call your doctor for medical advice about side effects. You may report side effects to FDA at 1-800-FDA-1088. Where should I keep my medicine? Keep out of the reach of children. Store at room temperature between 20 and 25 degrees C (68 and 77 degrees F), away from direct sunlight and moisture. Keep tightly closed. Throw away any unused medicine after the expiration date. NOTE: This sheet is a summary. It may not cover all possible information. If you have questions about this medicine, talk to your doctor, pharmacist, or health care provider.    2016, Elsevier/Gold Standard. (2012-08-30 12:41:10)

## 2015-05-14 ENCOUNTER — Ambulatory Visit: Payer: BLUE CROSS/BLUE SHIELD | Admitting: Family Medicine

## 2015-10-19 DIAGNOSIS — H5213 Myopia, bilateral: Secondary | ICD-10-CM | POA: Diagnosis not present

## 2016-05-30 ENCOUNTER — Ambulatory Visit (INDEPENDENT_AMBULATORY_CARE_PROVIDER_SITE_OTHER): Payer: BLUE CROSS/BLUE SHIELD | Admitting: Nurse Practitioner

## 2016-05-30 ENCOUNTER — Encounter: Payer: Self-pay | Admitting: Nurse Practitioner

## 2016-05-30 ENCOUNTER — Ambulatory Visit
Admission: RE | Admit: 2016-05-30 | Discharge: 2016-05-30 | Disposition: A | Discharge status: Home / Self Care | Payer: BLUE CROSS/BLUE SHIELD | Source: Ambulatory Visit | Attending: Nurse Practitioner | Admitting: Nurse Practitioner

## 2016-05-30 VITALS — BP 123/72 | HR 67 | Temp 98.3°F | Resp 16 | Ht 72.0 in | Wt 279.0 lb

## 2016-05-30 DIAGNOSIS — Z6837 Body mass index (BMI) 37.0-37.9, adult: Secondary | ICD-10-CM | POA: Diagnosis not present

## 2016-05-30 DIAGNOSIS — Z716 Tobacco abuse counseling: Secondary | ICD-10-CM | POA: Diagnosis not present

## 2016-05-30 DIAGNOSIS — G8929 Other chronic pain: Secondary | ICD-10-CM

## 2016-05-30 DIAGNOSIS — Z0000 Encounter for general adult medical examination without abnormal findings: Secondary | ICD-10-CM

## 2016-05-30 DIAGNOSIS — M1711 Unilateral primary osteoarthritis, right knee: Secondary | ICD-10-CM | POA: Diagnosis not present

## 2016-05-30 DIAGNOSIS — Z720 Tobacco use: Secondary | ICD-10-CM

## 2016-05-30 DIAGNOSIS — M25561 Pain in right knee: Secondary | ICD-10-CM | POA: Diagnosis present

## 2016-05-30 DIAGNOSIS — Z7689 Persons encountering health services in other specified circumstances: Secondary | ICD-10-CM | POA: Insufficient documentation

## 2016-05-30 LAB — COMPLETE METABOLIC PANEL WITH GFR
ALT: 11 U/L (ref 9–46)
AST: 13 U/L (ref 10–40)
Albumin: 4.3 g/dL (ref 3.6–5.1)
Alkaline Phosphatase: 69 U/L (ref 40–115)
BUN: 12 mg/dL (ref 7–25)
CO2: 24 mmol/L (ref 20–31)
Calcium: 9.1 mg/dL (ref 8.6–10.3)
Chloride: 104 mmol/L (ref 98–110)
Creat: 0.93 mg/dL (ref 0.60–1.35)
GFR, Est African American: >89 mL/min (ref >=60)
GFR, Est Non African American: >89 mL/min (ref >=60)
Glucose, Bld: 86 mg/dL (ref 65–99)
Potassium: 4.2 mmol/L (ref 3.5–5.3)
Sodium: 138 mmol/L (ref 135–146)
Total Bilirubin: 0.5 mg/dL (ref 0.2–1.2)
Total Protein: 7.3 g/dL (ref 6.1–8.1)

## 2016-05-30 LAB — LIPID PANEL
Cholesterol: 163 mg/dL (ref <200)
HDL: 32 mg/dL — ABNORMAL LOW (ref >40)
LDL Cholesterol: 107 mg/dL — ABNORMAL HIGH (ref <100)
Total CHOL/HDL Ratio: 5.1 Ratio — ABNORMAL HIGH (ref <5.0)
Triglycerides: 119 mg/dL (ref <150)
VLDL: 24 mg/dL (ref <30)

## 2016-05-30 MED ORDER — VARENICLINE TARTRATE 0.5 MG X 11 & 1 MG X 42 PO MISC: ORAL | 0 refills | Status: DC

## 2016-05-30 NOTE — Patient Instructions (Addendum)
Daleon, Thank you for coming in to clinic today.  1. For your annual physical, we are checking a one-time lipid panel to screen for high cholesterol.  We are also checking electrolytes, kidney, and liver function for baseline and to screen for glucose and diabetes.  2. For your weight loss:  Continue eating a healthier diet and reducing overall calories.  Continue an active lifestyle.  Work toward 1/2 to 1 lb per week and about 25 lbs in a year.  3. For your right knee pain: - We are starting with an X-Ray to evaluate the joint space.  If no joint space changes, I will suggest and order physical therapy to strengthen the muscles around your knee and improve the stability of your knee joint.  If there are joint space changes, I will place a referral to orthopedics for additional evaluation.  4. For smoking cessation, we are going to start chantix.  Set a quit date for 7 days after starting Chantix.  Start Varenicline (Chantix) - Day 1-3: Take one 0.5mg  tablet once daily WITH FOOD - Days 4-7: increase to one 0.5mg  tablet twice per day.  - Days 7- 12 weeks max: Increase to one  tablet twice per day for up to 12 weeks - Set quit date 1 week after start of medication.  If unable to quit completely, then set goal reduce by 50% or more by 4 weeks, then another 50% reduction in 4 more weeks, and lastly quit after final 4 weeks.  - We can continue Chantix for up to 12 weeks total if needed for maintenance. - Common side effects include nausea (take with food), headaches, insomnia, vivid dreams, mood instability, seizure, and a rare risk of suicidal ideation.  If you have any serious agitation or acute depression with severe mood changes you need to stop taking the medicine immediately  - Quit tips: choose something to occupy your hands that you will carry in your pocket.  It can be a rock or other object that can prevent your boredom.  Please schedule a follow-up appointment with Wilhelmina Mcardle,  AGNP in 1 month for smoking cessation follow up.   Schedule your next annual physical in 1 year.  If you have any other questions or concerns, please feel free to call the clinic or send a message through MyChart. You may also schedule an earlier appointment if necessary.  Wilhelmina Mcardle, DNP, AGNP-BC Adult Gerontology Nurse Practitioner [facility] [place], [facility]   Coping with Quitting Smoking Quitting smoking is a physical and mental challenge. You will face cravings, withdrawal symptoms, and temptation. Before quitting, work with your health care provider to make a plan that can help you cope. Preparation can help you quit and keep you from giving in. How can I cope with cravings? Cravings usually last for 5-10 minutes. If you get through it, the craving will pass. Consider taking the following actions to help you cope with cravings:  Keep your mouth busy:  Chew sugar-free gum.  Suck on hard candies or a straw.  Brush your teeth.  Keep your hands and body busy:  Immediately change to a different activity when you feel a craving.  Squeeze or play with a ball.  Do an activity or a hobby, like making bead jewelry, practicing needlepoint, or working with wood.  Mix up your normal routine.  Take a short exercise break. Go for a quick walk or run up and down stairs.  Spend time in public places where smoking  is not allowed.  Focus on doing something kind or helpful for someone else.  Call a friend or family member to talk during a craving.  Join a support group.  Call a quit line, such as 1-800-QUIT-NOW.  Talk with your health care provider about medicines that might help you cope with cravings and make quitting easier for you. How can I deal with withdrawal symptoms? Your body may experience negative effects as it tries to get used to not having nicotine in the system. These effects are called withdrawal symptoms. They may include:  Feeling hungrier than  normal.  Trouble concentrating.  Irritability.  Trouble sleeping.  Feeling depressed.  Restlessness and agitation.  Craving a cigarette. 1.  To manage withdrawal symptoms:  Avoid places, people, and activities that trigger your cravings.  Remember why you want to quit.  Get plenty of sleep.  Avoid coffee and other caffeinated drinks. These may worsen some of your symptoms. How can I handle social situations? Social situations can be difficult when you are quitting smoking, especially in the first few weeks. To manage this, you can:  Avoid parties, bars, and other social situations where people might be smoking.  Avoid alcohol.  Leave right away if you have the urge to smoke.  Explain to your family and friends that you are quitting smoking. Ask for understanding and support.  Plan activities with friends or family where smoking is not an option. What are some ways I can cope with stress? Wanting to smoke may cause stress, and stress can make you want to smoke. Find ways to manage your stress. Relaxation techniques can help. For example:  Breathe slowly and deeply, in through your nose and out through your mouth.  Listen to soothing, relaxing music.  Talk with a family member or friend about your stress.  Light a candle.  Soak in a bath or take a shower.  Think about a peaceful place. What are some ways I can prevent weight gain? Be aware that many people gain weight after they quit smoking. However, not everyone does. To keep from gaining weight, have a plan in place before you quit and stick to the plan after you quit. Your plan should include:  Having healthy snacks. When you have a craving, it may help to:  Eat plain popcorn, crunchy carrots, celery, or other cut vegetables.  Chew sugar-free gum.  Changing how you eat:  Eat small portion sizes at meals.  Eat 4-6 small meals throughout the day instead of 1-2 large meals a day.  Be mindful when you  eat. Do not watch television or do other things that might distract you as you eat.  Exercising regularly:  Make time to exercise each day. If you do not have time for a long workout, do short bouts of exercise for 5-10 minutes several times a day.  Do some form of strengthening exercise, like weight lifting, and some form of aerobic exercise, like running or swimming.  Drinking plenty of water or other low-calorie or no-calorie drinks. Drink 6-8 glasses of water daily, or as much as instructed by your health care provider. Summary  Quitting smoking is a physical and mental challenge. You will face cravings, withdrawal symptoms, and temptation to smoke again. Preparation can help you as you go through these challenges.  You can cope with cravings by keeping your mouth busy (such as by chewing gum), keeping your body and hands busy, and making calls to family, friends, or a helpline for  people who want to quit smoking.  You can cope with withdrawal symptoms by avoiding places where people smoke, avoiding drinks with caffeine, and getting plenty of rest.  Ask your health care provider about the different ways to prevent weight gain, avoid stress, and handle social situations. This information is not intended to replace advice given to you by your health care provider. Make sure you discuss any questions you have with your health care provider. Document Released: 02/04/2016 Document Revised: 02/04/2016 Document Reviewed: 02/04/2016 Elsevier Interactive Patient Education  2017 [employer].

## 2016-05-30 NOTE — Progress Notes (Signed)
Subjective:    Patient ID: Howard Bauer, male    DOB: [birth year], 31 y.o.   MRN: [id number]  Howard Bauer is a 31 y.o. male presenting on 05/30/2016 for Annual Exam   HPI   Patient states he feels well and has no major complaints.  Tobacco use/Smoking Cessation Patient currently smokes 1-1.5 ppd and has tried Chantix and had vivid dreams and welbutrin and felt drained.  He does desire to quit and is willing to try Chantix again.  The last time tried to quit was 6 months ago using welbutrin.  Welbutrin and chantix helped reduce cigarettes smoked per day, but he didn't quit completely.  He admits he smokes r/t stress and boredom.  Tried sunflower seeds in past but had mucosal membrane trauma.  Staying busy helps reduce cravings, but the worst time is driving.    Chronic, relapsing, remitting Right knee pain He had a R knee sprain in august of 2016 and now notices grinding and popping noises that are worse when he steps and feels bones shift side to side.  This occurs at random times and he can go for weeks without issues.  After experiencing knee re-injury he has swelling and pain.  Pt requests evaluation and agrees to X-ray.  BMI 37.84 Patient reports weight loss of 24 lbs since November 2016 with lifestyle changes to "eat better."  This includes less fried food.  The patient and his wife and pt are doing the healthy wage program together and are seeing success.  The biggest and best change was no soda.  Physical activity includes yardwork and stuff at home "off the couch."  Patient is fasting today and agrees to screening labwork. Past Medical History:  Diagnosis Date  . GERD (gastroesophageal reflux disease)    in past  . Obesity   . Peritonsillar abscess No past surgical history on file. Social History   Social History  . Marital status: Married    Spouse name: N/A  . Number of children: N/A  . Years of education: N/A   Occupational History    . Not on file.   Social History Main Topics  . Smoking status: Current Every Day Smoker    Packs/day: 1.25    Types: Cigarettes  . Smokeless tobacco: Current User     Comment: 1 to 1.5 packs  . Alcohol use No  . Drug use: No  . Sexual activity: Yes    Birth control/ protection: None     Comment: monogamous, partner with tubal ligation   Other Topics Concern  . Not on file   Social History Narrative  . No narrative on file   Family History  Problem Relation Age of Onset  . Heart disease Mother   . Cancer Mother     ovarian cancer  . Heart disease Father   . Stroke Father   . Diabetes Sister     type 1  . Cancer Maternal Grandmother     liver cancer   No current outpatient prescriptions on file prior to visit.   No current facility-administered medications on file prior to visit.     Review of Systems  All other systems reviewed and are negative.  Per HPI unless specifically indicated above      Objective:    BP 123/72   Pulse 67   Temp 98.3 F (36.8 C) (Oral)   Resp 16   Ht 6' (1.829 m)   Wt 279  lb (126.6 kg)   BMI 37.84 kg/m  Waist 45 Wt Readings from Last 3 Encounters:  05/30/16 279 lb (126.6 kg)  02/09/15 (!) 303 lb (137.4 kg)  01/10/15 295 lb (133.8 kg)    Physical Exam  Constitutional: He is oriented to person, place, and time. He appears well-developed and well-nourished.  obese  HENT:  Head: Normocephalic and atraumatic.  Right Ear: Tympanic membrane, external ear and ear canal normal.  Left Ear: Tympanic membrane, external ear and ear canal normal.  Nose: Nose normal.  Mouth/Throat: Oropharynx is clear and moist.  Eyes: Conjunctivae and EOM are normal. Pupils are equal, round, and reactive to light.  Neck: Normal range of motion. Neck supple. No thyromegaly present.  Cardiovascular: Normal rate, regular rhythm, normal heart sounds and intact distal pulses.   Pulmonary/Chest: Effort normal and breath sounds normal.  Abdominal: Soft.  Bowel sounds are normal. He exhibits no distension. There is no tenderness.  Musculoskeletal: Normal range of motion.       Right knee: Normal.  Varus position when standing  Lymphadenopathy:    He has no cervical adenopathy.  Neurological: He is alert and oriented to person, place, and time.  Skin: Skin is warm and dry.  Psychiatric: He has a normal mood and affect. His behavior is normal. He expresses no homicidal and no suicidal ideation. He expresses no suicidal plans and no homicidal plans.   DG Knee Complete 4 Views Right CLINICAL DATA:  Pain and swelling  EXAM: RIGHT KNEE - COMPLETE 4+ VIEW  COMPARISON:  None.  FINDINGS: Standing frontal, standing oblique, prone tunnel, lateral, and sunrise patellar images obtained. There is no fracture or dislocation. No joint effusion. There is moderate joint space narrowing medially. There is spurring medially. Other joint compartments appear unremarkable. No erosive change.  IMPRESSION: Osteoarthritic change medially.  No fracture or joint effusion.  Electronically Signed   By: Bretta Bang III M.D.   On: 05/30/2016 10:34      Assessment & Plan:  1. Annual physical exam Health maintenance reviewed and updated. HIV screening declined. Obesity indicates screening for hyperlipidemia and diabetes. - Lipid panel - COMPLETE METABOLIC PANEL WITH GFR  2. Chronic pain of Right knee Episodic pain exacerbated by injury/instability.  Remote history of sprain followed by joint laxity.  Exam normal today. Plan: - Plan in-office 4-view X-ray, which reveals osteoarthritis, bone spurs and medial thinning of joint space. - Manage with anti-inflamatory medications.  - Start taking Tylenol extra strength 1 to 2 tablets every 6-8 hours for aches as needed. Do not take more than 3,000 mg in 24 hours from all medicines.   - May take Ibuprofen 200-400mg  every 8 hours as needed.  - Use heat and ice. Apply this for 15 minutes at a time up  to 6-8 times per day.   - May also use muscle rub with lidocaine, but avoid using with heat and ice to avoid burns.  - Encouraged further weight loss for overall joint health. - PT referral placed for strengthening.  Proceed with orthopedic referral if indicated after PT trial.  3. Body mass index (BMI) of 37.0 to 37.9 in adult Improving with 24 lb weight loss in 1 year.  - Celebrated current weight loss and encouraged continued weight loss. Continue and iIncrease physical activity.  Continue adding vegetables to diet and reducing fried foods. - Screen for hyperlipidemia r/t at risk with obesity. - Lipid panel  4. Current tobacco use 5. Encounter for smoking cessation counseling  Patient ready to quit.    - Start varenicline (Chantix) Day 1-3: Take one 0.5mg  tablet once daily with food; Days 4-7: increase to one 0.5mg  tablet twice per day; Days 7- 12 weeks max: Increase to one  tablet twice per day for up to 12 weeks. - Advised to set quit date 1 week after start of medication.  If unable to quit completely, reduce by 50%. - Consider continuation of Chantix for up to 12 weeks total if needed for maintenance. - Advised about common side effects and possibility for depression and SI.  If experiencing depression or SI, STOP medicine.  - varenicline (CHANTIX STARTING MONTH PAK) 0.5 MG X 11 & 1 MG X 42 tablet; Take one 0.5 mg tablet by mouth once daily for 3 days, then one 0.5 mg tablet twice daily for 4 days, then one 1 mg tablet twice daily.  Dispense: 53 tablet; Refill: 0    Meds ordered this encounter  Medications  . varenicline (CHANTIX STARTING MONTH PAK) 0.5 MG X 11 & 1 MG X 42 tablet    Sig: Take one 0.5 mg tablet by mouth once daily for 3 days, then one 0.5 mg tablet twice daily for 4 days, then one 1 mg tablet twice daily.    Dispense:  53 tablet    Refill:  0    Order Specific Question:   Supervising Provider    Answer:   Smitty Cords [[id number]]     Discussion  today >10 minutes specifically on counseling on risks of tobacco use, complications, treatment, smoking cessation.  Start Chantix for 1 month and can continue for up to 12 weeks.   Follow up plan: Return in about 1 month (around 06/29/2016) for 1 month smoking cessation & 1 year - annual physical or as needed.  Wilhelmina Mcardle, DNP, AGPCNP-BC Adult Gerontology Primary Care Nurse Practitioner [facility] 05/30/2016, 9:51 AM

## 2016-06-01 NOTE — Progress Notes (Signed)
I have reviewed this encounter including the documentation in this note and/or discussed this patient with the provider, Wilhelmina Mcardle, AGPCNP-BC. I am certifying that I agree with the content of this note as supervising physician.  Saralyn Pilar, DO [facility] 06/01/2016, 8:11 AM

## 2016-06-27 ENCOUNTER — Encounter: Payer: Self-pay | Admitting: Nurse Practitioner

## 2016-06-27 ENCOUNTER — Ambulatory Visit (INDEPENDENT_AMBULATORY_CARE_PROVIDER_SITE_OTHER): Payer: BLUE CROSS/BLUE SHIELD | Admitting: Nurse Practitioner

## 2016-06-27 VITALS — BP 123/68 | HR 78 | Temp 98.2°F | Resp 16 | Ht 72.0 in | Wt 285.2 lb

## 2016-06-27 DIAGNOSIS — Z716 Tobacco abuse counseling: Secondary | ICD-10-CM

## 2016-06-27 DIAGNOSIS — Z720 Tobacco use: Secondary | ICD-10-CM

## 2016-06-27 MED ORDER — VARENICLINE TARTRATE 1 MG PO TABS: 1.0000 mg | ORAL_TABLET | Freq: Two times a day (BID) | ORAL | 1 refills | Status: DC

## 2016-06-27 NOTE — Progress Notes (Signed)
Subjective:    Patient ID: Howard Bauer, male    DOB: 05-Aug-1985, 31 y.o.   MRN: [id number]  Howard Bauer is a 31 y.o. male presenting on 06/27/2016 for No chief complaint on file.   HPI Smoking Cessation Zuri has been taking Chantix for 4 weeks.  He has been able to quit smoking completely for the last 7 days.  He is having no cravings unless around others who are smoking. He is noticing that he is eating more.  He has gained 6 lbs since his last visit.  He reports no significant nausea, depression or suicidal ideation. He is noticing being "easily aggravated" and only 1-2 vivid dreams.  He has used Chantix in the past with significant vivid dreams, but notes this is much better than before.   He does desire to continue Chantix for at least 4 more weeks and possibly up to a total of 12 weeks.  Social History  Substance Use Topics  . Smoking status: Current Every Day Smoker    Packs/day: 1.25    Types: Cigarettes  . Smokeless tobacco: Current User     Comment: 1 to 1.5 packs  . Alcohol use No    Review of Systems  Constitutional: Positive for appetite change.  Respiratory: Negative.   Cardiovascular: Negative.   Gastrointestinal: Negative.   Neurological: Negative.   Psychiatric/Behavioral: Negative.  Negative for self-injury, sleep disturbance and suicidal ideas.   Per HPI unless specifically indicated above     Objective:    There were no vitals taken for this visit.  Wt Readings from Last 3 Encounters:  05/30/16 279 lb (126.6 kg)  02/09/15 (!) 303 lb (137.4 kg)  01/10/15 295 lb (133.8 kg)    Physical Exam  Constitutional: He is oriented to person, place, and time. He appears well-developed and well-nourished. No distress.  HENT:  Head: Normocephalic and atraumatic.  Neck: Normal range of motion. Neck supple. No tracheal deviation present.  Cardiovascular: Normal rate, regular rhythm and normal heart sounds.   Pulmonary/Chest: Effort  normal and breath sounds normal. No respiratory distress. He has no wheezes. He has no rales.  Lymphadenopathy:    He has no cervical adenopathy.  Neurological: He is alert and oriented to person, place, and time.  Skin: Skin is warm and dry.  Psychiatric: He has a normal mood and affect. His behavior is normal.         Assessment & Plan:   Problem List Items Addressed This Visit    None    Visit Diagnoses    Encounter for smoking cessation counseling    -  Primary Patient has quit smoking for 1 week on Chantix. Desires to continue Chantix for now.  Tolerating well without significant side effects, importantly no depression or SI.  Plan: 1. Continue Chantix for up to 8 more weeks. 2. Return to clinic in 2 months or sooner for re-evaluation of smoking cessation or need for continuing therapy to stay quit.   Discussion today >10 minutes specifically on counseling on risks of tobacco use, complications, treatment, smoking cessation. Discussed side effects of Chantix, ongoing use of Chantix, tips to stay quit and recommendations for curbing his weight gain.    Relevant Medications   varenicline (CHANTIX CONTINUING MONTH PAK) 1 MG tablet      Meds ordered this encounter  Medications  . varenicline (CHANTIX CONTINUING MONTH PAK) 1 MG tablet    Sig: Take 1 tablet (1 mg total) by mouth 2 (two)  times daily.    Dispense:  30 tablet    Refill:  1    Order Specific Question:   Supervising Provider    Answer:   Smitty CordsKARAMALEGOS, ALEXANDER J [[id number]]     Follow up plan: Return in about 2 months (around 08/27/2016) for smoking cessation.    Wilhelmina McardleLauren Jelani Vreeland, DNP, AGPCNP-BC Adult Gerontology Primary Care Nurse Practitioner [facility] 06/27/2016, 9:13 AM

## 2016-06-27 NOTE — Patient Instructions (Signed)
Howard Bauer, Thank you for coming in to clinic today.  1. For your smoking cessation: - GREAT WORK! - Continue your chantix.  Please schedule a follow-up appointment with Howard Bauer [name], AGNP to Return in about 2 months (around 08/27/2016) for smoking cessation.  Can be cancelled if not needed.  If you have any other questions or concerns, please feel free to call the clinic or send a message through MyChart. You may also schedule an earlier appointment if necessary.  Howard Bauer Noelle Sease, DNP, AGNP-BC Adult Gerontology Nurse Practitioner [facility], [facility]

## 2016-06-28 NOTE — Progress Notes (Signed)
I have reviewed this encounter including the documentation in this note and/or discussed this patient with the provider, Wilhelmina McardleLauren Kennedy, AGPCNP-BC. I am certifying that I agree with the content of this note as supervising physician.  Saralyn PilarAlexander Karamalegos, DO [facility] 06/28/2016, 9:20 PM

## 2016-09-05 ENCOUNTER — Ambulatory Visit: Payer: BLUE CROSS/BLUE SHIELD | Admitting: Nurse Practitioner

## 2017-12-17 ENCOUNTER — Other Ambulatory Visit: Payer: Self-pay

## 2017-12-17 ENCOUNTER — Ambulatory Visit (INDEPENDENT_AMBULATORY_CARE_PROVIDER_SITE_OTHER): Payer: BLUE CROSS/BLUE SHIELD | Admitting: Nurse Practitioner

## 2017-12-17 ENCOUNTER — Encounter: Payer: Self-pay | Admitting: Nurse Practitioner

## 2017-12-17 VITALS — BP 118/77 | HR 101 | Temp 98.8°F | Resp 17 | Ht 72.0 in | Wt 295.8 lb

## 2017-12-17 DIAGNOSIS — Z716 Tobacco abuse counseling: Secondary | ICD-10-CM | POA: Diagnosis not present

## 2017-12-17 DIAGNOSIS — Z23 Encounter for immunization: Secondary | ICD-10-CM | POA: Diagnosis not present

## 2017-12-17 DIAGNOSIS — Z0000 Encounter for general adult medical examination without abnormal findings: Secondary | ICD-10-CM

## 2017-12-17 MED ORDER — VARENICLINE TARTRATE 0.5 MG X 11 & 1 MG X 42 PO MISC: ORAL | 0 refills | Status: DC

## 2017-12-17 MED ORDER — VARENICLINE TARTRATE 1 MG PO TABS: 1.0000 mg | ORAL_TABLET | Freq: Two times a day (BID) | ORAL | 0 refills | Status: DC

## 2017-12-17 NOTE — Progress Notes (Signed)
Subjective:    Patient ID: Howard Bauer, male    DOB: April 24, 1985, 32 y.o.   MRN: [id number]  Howard Bauer is a 32 y.o. male presenting on 12/17/2017 for Annual Exam (pt recently )   HPI Annual Physical Exam Patient has been feeling fairly well.  They have acute concerns today regarding recent grief.  Sleeps 5-6 hours per night with about one interruption per night.  Difficulty falling asleep, wakes up, never feels rested in am.  Death of a close coworker whom he found unresponsive 1 year ago and 1 week ago his father died.  HEALTH MAINTENANCE: Weight/BMI: 290-265 in 2-3 months and stopped diet.  Now only 10 lbs higher. Physical activity: walking some at work, no structure Diet: poor vegetables Seatbelt: always Sunscreen: rarely HIV: Declines screening Optometry: regular around every 2-3 years Dentistry: not regular  VACCINES: Tetanus: 2016 Influenza: desires today  Past Medical History:  Diagnosis Date  . GERD (gastroesophageal reflux disease)    in past  . Obesity   . Peritonsillar abscess 11 20 16    No past surgical history on file. Social History   Socioeconomic History  . Marital status: Married    Spouse name: Not on file  . Number of children: Not on file  . Years of education: Not on file  . Highest education level: Not on file  Occupational History  . Not on file  Social Needs  . Financial resource strain: Not on file  . Food insecurity:    Worry: Not on file    Inability: Not on file  . Transportation needs:    Medical: Not on file    Non-medical: Not on file  Tobacco Use  . Smoking status: Current Every Day Smoker    Packs/day: 1.00    Types: Cigarettes    Last attempt to quit: 06/20/2016    Years since quitting: 1.4  . Smokeless tobacco: Former Engineer, water and Sexual Activity  . Alcohol use: No  . Drug use: No  . Sexual activity: Yes    Birth control/protection: None    Comment: monogamous, partner with tubal  ligation  Lifestyle  . Physical activity:    Days per week: Not on file    Minutes per session: Not on file  . Stress: Not on file  Relationships  . Social connections:    Talks on phone: Not on file    Gets together: Not on file    Attends religious service: Not on file    Active member of club or organization: Not on file    Attends meetings of clubs or organizations: Not on file    Relationship status: Not on file  . Intimate partner violence:    Fear of current or ex partner: Not on file    Emotionally abused: Not on file    Physically abused: Not on file    Forced sexual activity: Not on file  Other Topics Concern  . Not on file  Social History Narrative  . Not on file   Family History  Problem Relation Age of Onset  . Heart disease Mother   . Cancer Mother        ovarian cancer  . Heart disease Father   . Stroke Father   . Diabetes Sister        type 1  . Cancer Maternal Grandmother        liver cancer   No current outpatient medications on file prior to visit.  No current facility-administered medications on file prior to visit.     Review of Systems  Constitutional: Negative for activity change, appetite change, fatigue and unexpected weight change.  HENT: Negative for congestion, hearing loss and trouble swallowing.   Eyes: Negative for visual disturbance.  Respiratory: Negative for choking, shortness of breath and wheezing.   Cardiovascular: Negative for chest pain and palpitations.  Gastrointestinal: Negative for abdominal pain, blood in stool, constipation and diarrhea.  Genitourinary: Negative for difficulty urinating, discharge, flank pain, genital sores, penile pain, penile swelling, scrotal swelling and testicular pain.  Musculoskeletal: Negative for arthralgias, back pain and myalgias.  Skin: Negative for color change, rash and wound.  Allergic/Immunologic: Negative for environmental allergies.  Neurological: Negative for dizziness, seizures,  weakness and headaches.  Psychiatric/Behavioral: Positive for sleep disturbance. Negative for behavioral problems, decreased concentration, dysphoric mood and suicidal ideas. The patient is not nervous/anxious.    Per HPI unless specifically indicated above     Objective:    BP 118/77   Pulse (!) 101   Temp 98.8 F (37.1 C) (Oral)   Resp 17   Ht 6' (1.829 m)   Wt 295 lb 12.8 oz (134.2 kg)   BMI 40.12 kg/m   Wt Readings from Last 3 Encounters:  12/17/17 295 lb 12.8 oz (134.2 kg)  06/27/16 285 lb 3.2 oz (129.4 kg)  05/30/16 279 lb (126.6 kg)    Physical Exam  Constitutional: He is oriented to person, place, and time. He appears well-developed and well-nourished. No distress.  Morbidly obese with central adiposity  HENT:  Head: Normocephalic and atraumatic.  Right Ear: External ear normal.  Left Ear: External ear normal.  Nose: Nose normal.  Mouth/Throat: Oropharynx is clear and moist.  Eyes: Pupils are equal, round, and reactive to light. Conjunctivae are normal.  Neck: Normal range of motion. Neck supple. No JVD present. No tracheal deviation present. No thyromegaly present.  Cardiovascular: Normal rate, regular rhythm, normal heart sounds and intact distal pulses. Exam reveals no gallop and no friction rub.  No murmur heard. Pulmonary/Chest: Effort normal and breath sounds normal. No respiratory distress.  Abdominal: Soft. Bowel sounds are normal. He exhibits no distension. There is no hepatosplenomegaly. There is no tenderness.  Musculoskeletal: Normal range of motion.  Lymphadenopathy:    He has no cervical adenopathy.  Neurological: He is alert and oriented to person, place, and time. No cranial nerve deficit.  Skin: Skin is warm and dry.  Psychiatric: His speech is normal and behavior is normal. Judgment and thought content normal. Cognition and memory are normal. He exhibits a depressed mood (occasionally tearful when discussing deaths of friend and father).  Nursing  note and vitals reviewed.    Results for orders placed or performed in visit on 05/30/16  Lipid panel  Result Value Ref Range   Cholesterol 163 <200 mg/dL   Triglycerides 629 <528 mg/dL   HDL 32 (L) >41 mg/dL   Total CHOL/HDL Ratio 5.1 (H) <5.0 Ratio   VLDL 24 <30 mg/dL   LDL Cholesterol 324 (H) <100 mg/dL  COMPLETE METABOLIC PANEL WITH GFR  Result Value Ref Range   Sodium 138 135 - 146 mmol/L   Potassium 4.2 3.5 - 5.3 mmol/L   Chloride 104 98 - 110 mmol/L   CO2 24 20 - 31 mmol/L   Glucose, Bld 86 65 - 99 mg/dL   BUN 12 7 - 25 mg/dL   Creat 4.01 0.27 - 2.53 mg/dL   Total Bilirubin 0.5 0.2 -  1.2 mg/dL   Alkaline Phosphatase 69 40 - 115 U/L   AST 13 10 - 40 U/L   ALT 11 9 - 46 U/L   Total Protein 7.3 6.1 - 8.1 g/dL   Albumin 4.3 3.6 - 5.1 g/dL   Calcium 9.1 8.6 - 16.1 mg/dL   GFR, Est African American >89 >=60 mL/min   GFR, Est Non African American >89 >=60 mL/min      Assessment & Plan:   Problem List Items Addressed This Visit    None    Visit Diagnoses    Encounter for annual physical exam    -  Primary Physical exam with new findings of depression 2/2 grief. No abnormal grief response at this time.  Well adult with no other acute concerns.  Plan: 1. Obtain health maintenance screenings as above according to age. - Increase physical activity to 30 minutes most days of the week.  - Eat healthy diet high in vegetables and fruits; low in refined carbohydrates. - Labs as ordered below for screening. - Encouraged to contact if grief response not continuing to progress as expected or desires counseling. 2. Return 1 year for annual physical.   Relevant Orders   Lipid panel   TSH   Comprehensive metabolic panel   Hemoglobin A1c   CBC with Differential/Platelet   Encounter for smoking cessation counseling     Patient with desire and readiness to quit smoking. Has previously been successful with quitting by using Chantix, which was well tolerated without increased  depression.  Plan: 1. 7 days prior to quit date, START Chantix starting pack.  Take 0.5 mg tablet once daily w/ food for 3 days.  Take 0.5 mg tablet twice daily w/ food for 3 days.  Then, take 1 mg tablet twice daily and continue for total treatment w/ Chantix of 4-12 weeks.  Stop smoking or reduce by half on quit date. - Mutually set quit date: 12/24/2017 - START date of Chantix: 12/18/2017 - Reviewed side effects of nausea, vivid dreams, depression, possible SI.  - Discussion today >5 minutes (<10 minutes) specifically on counseling on risks of tobacco use, complications, treatment, smoking cessation.  Can use alternative methods to curb cravings like peppermint, gums, walk around the building for a "smoke break," or keeping hands busy/occupied.    Relevant Medications   varenicline (CHANTIX STARTING MONTH PAK) 0.5 MG X 11 & 1 MG X 42 tablet   varenicline (CHANTIX CONTINUING MONTH PAK) 1 MG tablet (Start on 01/16/2018)   Needs flu shot     Pt < age 69.  Needs annual influenza vaccine.  Plan: 1. Administer Quad flu vaccine.    Relevant Orders   Flu Vaccine QUAD 6+ mos PF IM (Fluarix Quad PF) (Completed)      Meds ordered this encounter  Medications  . varenicline (CHANTIX STARTING MONTH PAK) 0.5 MG X 11 & 1 MG X 42 tablet    Sig: Take one 0.5 mg tab by mouth once daily for 3 days, then take one 0.5 mg tab twice daily for 4 days, then take one 1 mg tab twice daily.    Dispense:  53 tablet    Refill:  0    Order Specific Question:   Supervising Provider    Answer:   Smitty Cords [[id number]]  . varenicline (CHANTIX CONTINUING MONTH PAK) 1 MG tablet    Sig: Take 1 tablet (1 mg total) by mouth 2 (two) times daily.    Dispense:  60  tablet    Refill:  0    Order Specific Question:   Supervising Provider    Answer:   Smitty Cords [[id number]]    Follow up plan: Return in about 6 weeks (around 01/28/2018) for smoking cessation AND 1 year annual physical.  Wilhelmina Mcardle,  DNP, AGPCNP-BC Adult Gerontology Primary Care Nurse Practitioner [facility] 12/17/2017, 2:17 PM

## 2017-12-17 NOTE — Patient Instructions (Addendum)
Howard Bauer,   Thank you for coming in to clinic today.  1. For smoking cessation, we are going to start chantix.   Start varenicline (Chantix) 7 days BEFORE your quit date.    Your quit date: 12/24/2017      START Chantix on: 12/18/17   - Day 1-3: Take one 0.5mg  tablet once daily WITH FOOD - Days 4-7: increase to one 0.5mg  tablet twice per day.  - Days 7- 12 weeks max: Increase to one 1mg  tablet twice per day for up to 12 weeks  To quit smoking:  - Only start this treatment if you are mentally ready to quit. - Start Chantix 1 week before your quit date.  If unable to quit completely in 7 days, then work to reduce by 50% or more by 4 weeks, then another 50% reduction in 4 more weeks, and lastly quit after a final 4 weeks.  - We can continue Chantix for up to 12 weeks total if needed for maintenance. - Common side effects include nausea (take with food), headaches, insomnia, vivid dreams, mood instability, seizure, and a rare risk of suicidal ideation.  If you have any serious agitation or acute depression with severe mood changes you need to stop taking the medicine immediately    You will be due for FASTING BLOOD WORK.  This means you should eat no food or drink after midnight.  Drink only water or coffee without cream/sugar on the morning of your lab visit. - Please go ahead and schedule a "Lab Only" visit in the morning at the clinic for lab draw in the next 7 days. - Your results will be available about 2-3 days after blood draw.  If you have set up a MyChart account, you can can log in to MyChart online to view your results and a brief explanation. Also, we can discuss your results together at your next office visit if you would like.  Please schedule a follow-up appointment with Wilhelmina Mcardle, AGNP. Return in about 6 weeks (around 01/28/2018) for smoking cessation AND 1 year annual physical.  If you have any other questions or concerns, please feel free to call the clinic or  send a message through MyChart. You may also schedule an earlier appointment if necessary.  You will receive a survey after today's visit either digitally by e-mail or paper by [employer]. Your experiences and feedback matter to [place].  Please respond so we know how we are doing as we provide care for you.   Wilhelmina Mcardle, DNP, AGNP-BC Adult Gerontology Nurse Practitioner [facility], [facility]

## 2017-12-21 ENCOUNTER — Other Ambulatory Visit: Payer: BLUE CROSS/BLUE SHIELD

## 2017-12-21 DIAGNOSIS — Z0000 Encounter for general adult medical examination without abnormal findings: Secondary | ICD-10-CM | POA: Diagnosis not present

## 2017-12-22 LAB — CBC WITH DIFFERENTIAL/PLATELET
Basophils Absolute: 40 cells/uL (ref 0–200)
Basophils Relative: 0.5 %
Eosinophils Absolute: 142 cells/uL (ref 15–500)
Eosinophils Relative: 1.8 %
HCT: 41.9 % (ref 38.5–50.0)
Hemoglobin: 14.1 g/dL (ref 13.2–17.1)
Lymphs Abs: 2157 cells/uL (ref 850–3900)
MCH: 28.1 pg (ref 27.0–33.0)
MCHC: 33.7 g/dL (ref 32.0–36.0)
MCV: 83.5 fL (ref 80.0–100.0)
MPV: 10.5 fL (ref 7.5–12.5)
Monocytes Relative: 8.3 %
Neutro Abs: 4906 cells/uL (ref 1500–7800)
Neutrophils Relative %: 62.1 %
Platelets: 207 10*3/uL (ref 140–400)
RBC: 5.02 10*6/uL (ref 4.20–5.80)
RDW: 13.7 % (ref 11.0–15.0)
Total Lymphocyte: 27.3 %
WBC mixed population: 656 cells/uL (ref 200–950)
WBC: 7.9 10*3/uL (ref 3.8–10.8)

## 2017-12-22 LAB — LIPID PANEL
Cholesterol: 151 mg/dL (ref <200)
HDL: 30 mg/dL — ABNORMAL LOW (ref >40)
LDL Cholesterol (Calc): 94 mg/dL (calc)
Non-HDL Cholesterol (Calc): 121 mg/dL (calc) (ref <130)
Total CHOL/HDL Ratio: 5 (calc) — ABNORMAL HIGH (ref <5.0)
Triglycerides: 171 mg/dL — ABNORMAL HIGH (ref <150)

## 2017-12-22 LAB — COMPREHENSIVE METABOLIC PANEL
AG Ratio: 1.7 (calc) (ref 1.0–2.5)
ALT: 9 U/L (ref 9–46)
AST: 10 U/L (ref 10–40)
Albumin: 4.2 g/dL (ref 3.6–5.1)
Alkaline phosphatase (APISO): 60 U/L (ref 40–115)
BUN: 14 mg/dL (ref 7–25)
CO2: 26 mmol/L (ref 20–32)
Calcium: 8.9 mg/dL (ref 8.6–10.3)
Chloride: 105 mmol/L (ref 98–110)
Creat: 1.04 mg/dL (ref 0.60–1.35)
Globulin: 2.5 g/dL (calc) (ref 1.9–3.7)
Glucose, Bld: 78 mg/dL (ref 65–99)
Potassium: 4.5 mmol/L (ref 3.5–5.3)
Sodium: 143 mmol/L (ref 135–146)
Total Bilirubin: 0.3 mg/dL (ref 0.2–1.2)
Total Protein: 6.7 g/dL (ref 6.1–8.1)

## 2017-12-22 LAB — HEMOGLOBIN A1C
Hgb A1c MFr Bld: 5.6 % of total Hgb (ref <5.7)
Mean Plasma Glucose: 114 (calc)
eAG (mmol/L): 6.3 (calc)

## 2017-12-22 LAB — TSH: TSH: 1.46 mIU/L (ref 0.40–4.50)

## 2018-01-28 ENCOUNTER — Ambulatory Visit: Payer: BLUE CROSS/BLUE SHIELD | Admitting: Nurse Practitioner

## 2018-06-11 IMAGING — DX DG KNEE COMPLETE 4+V*R*
6 series · 6 of 6 positions shown · non-contrast
Comparison: None.

CLINICAL DATA: Pain and swelling

EXAM:
RIGHT KNEE - COMPLETE 4+ VIEW

[knee ap]
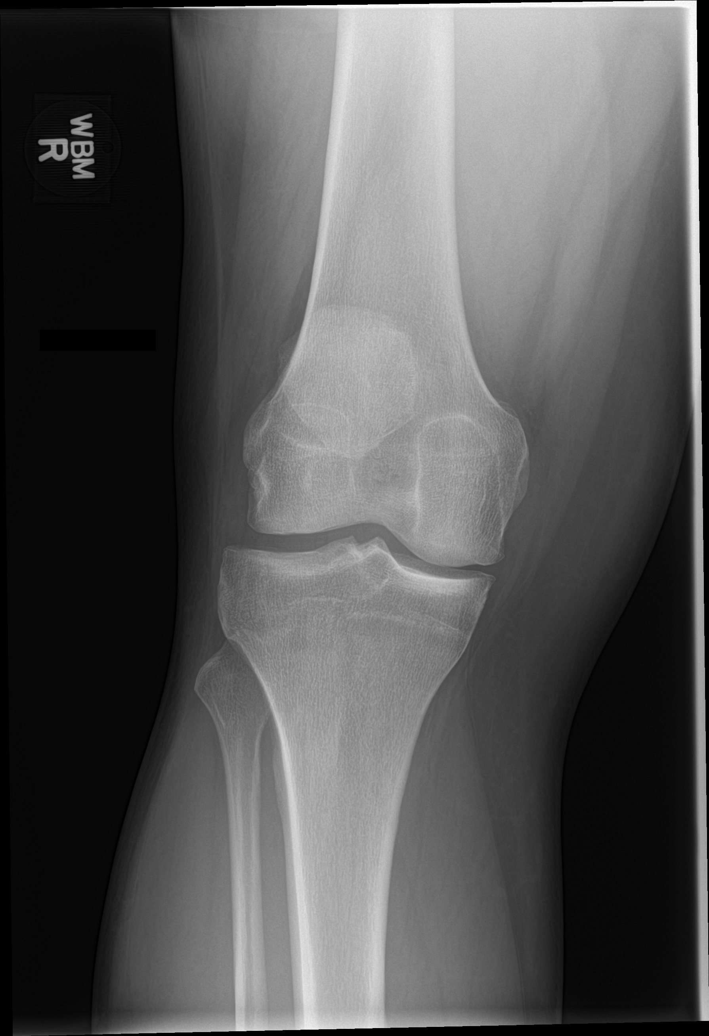

[knee tunnel (1 of 3)]
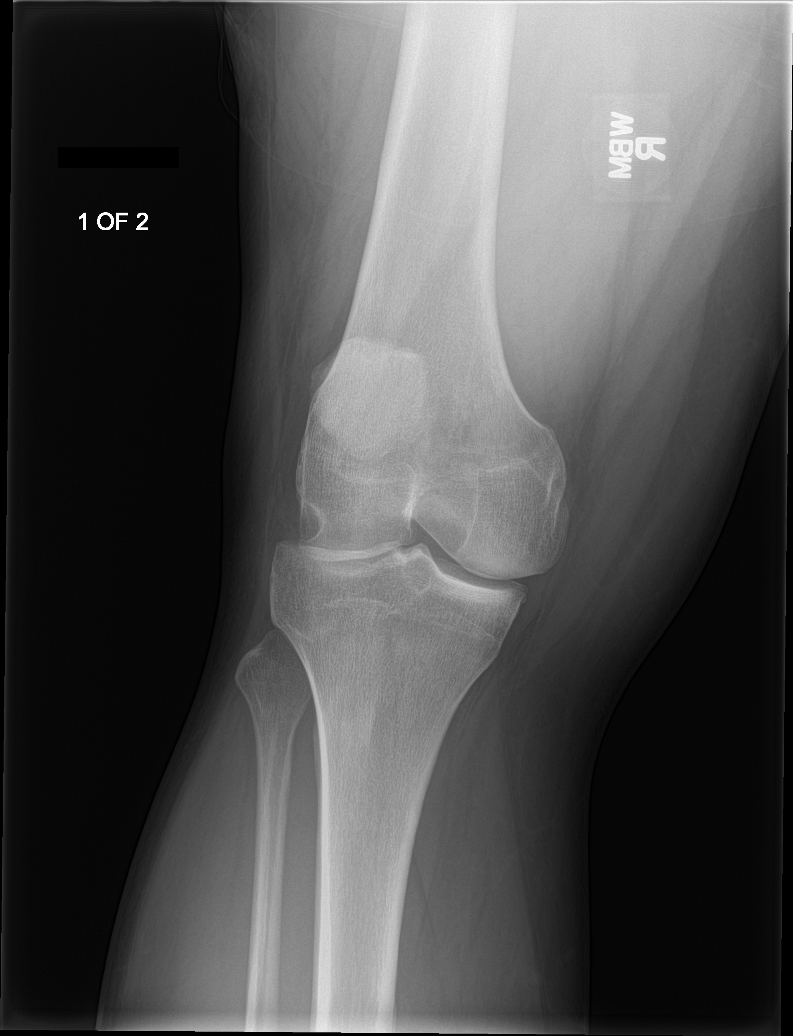

[knee lat]
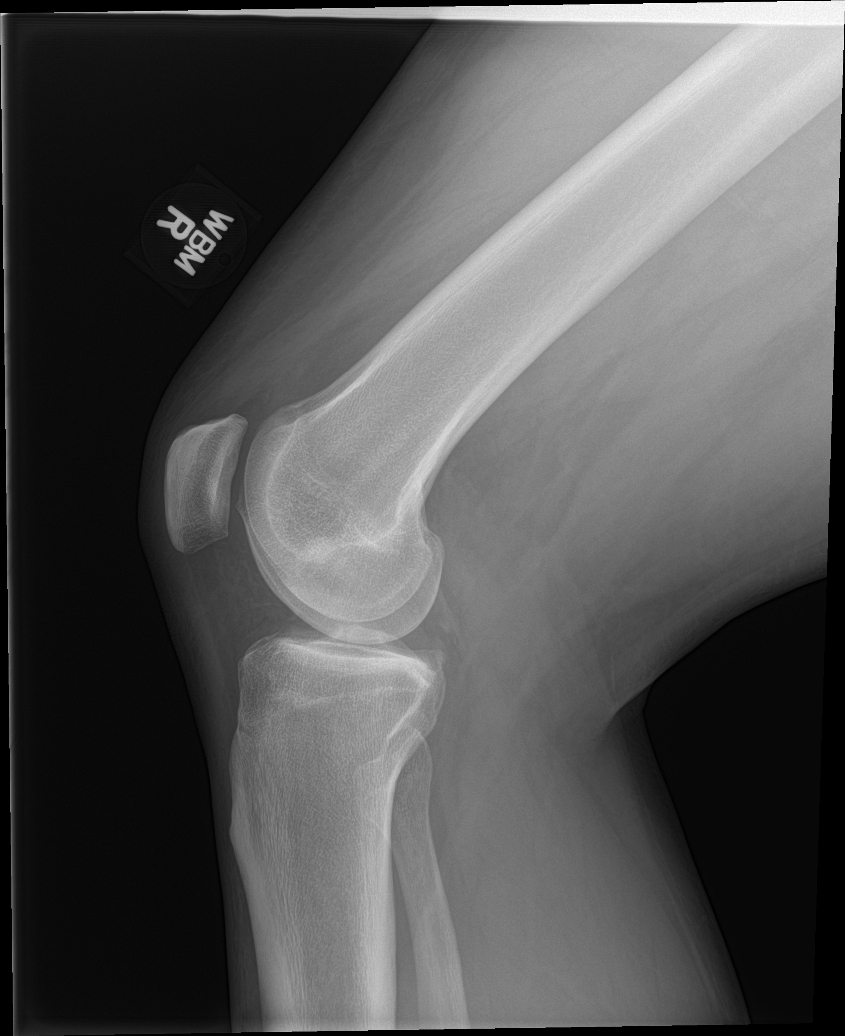

[patella skyline]
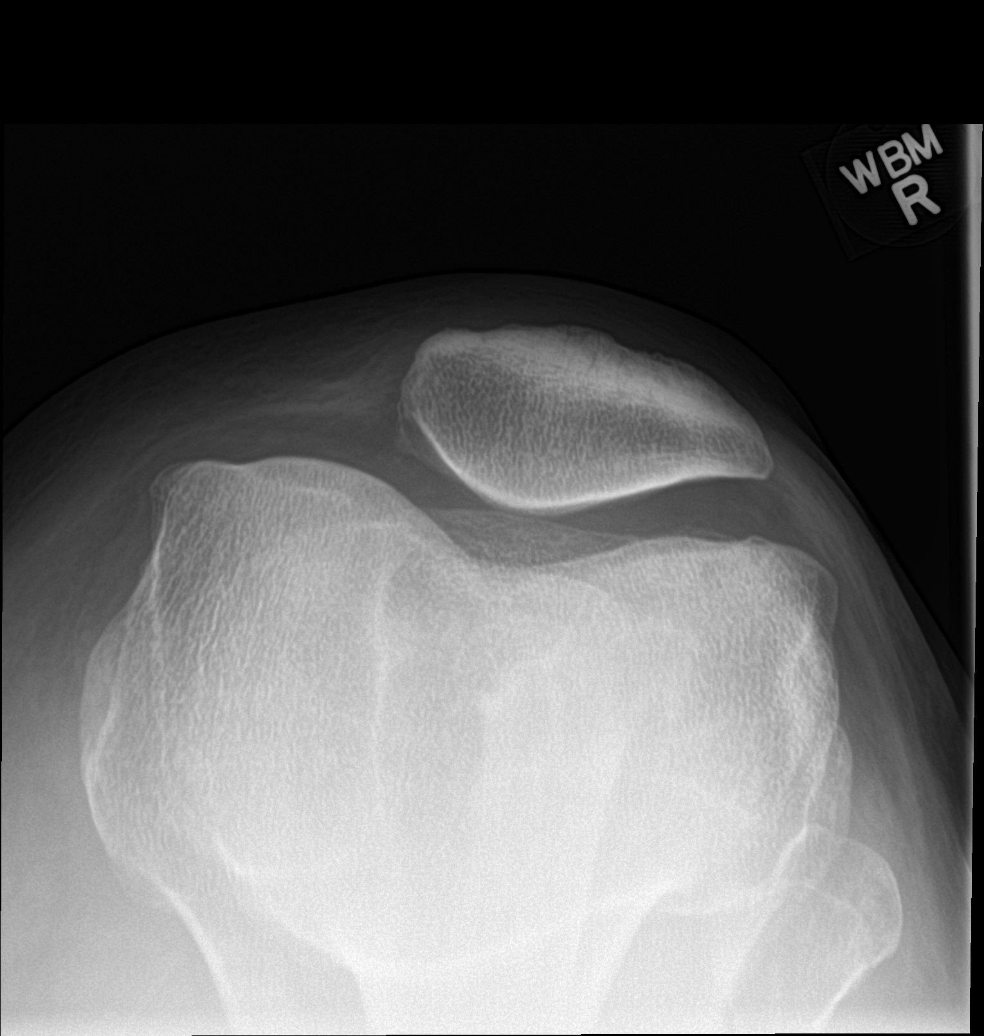

[knee tunnel (2 of 3)]
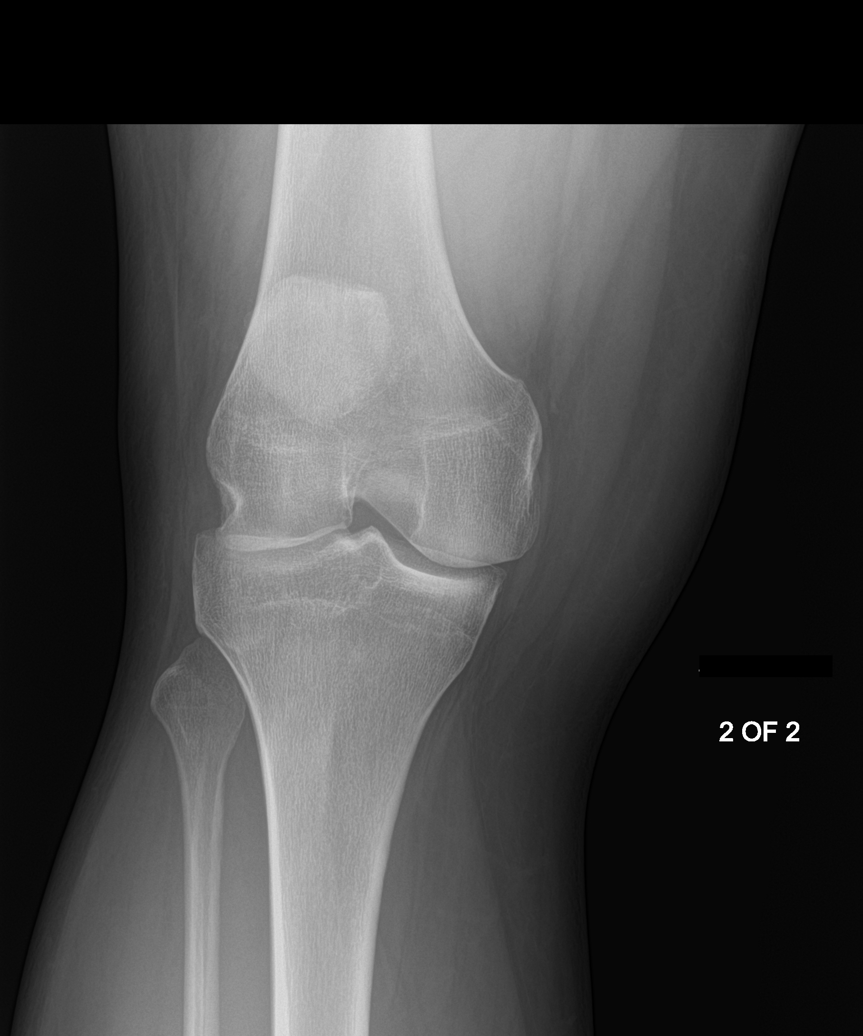

[knee tunnel (3 of 3)]
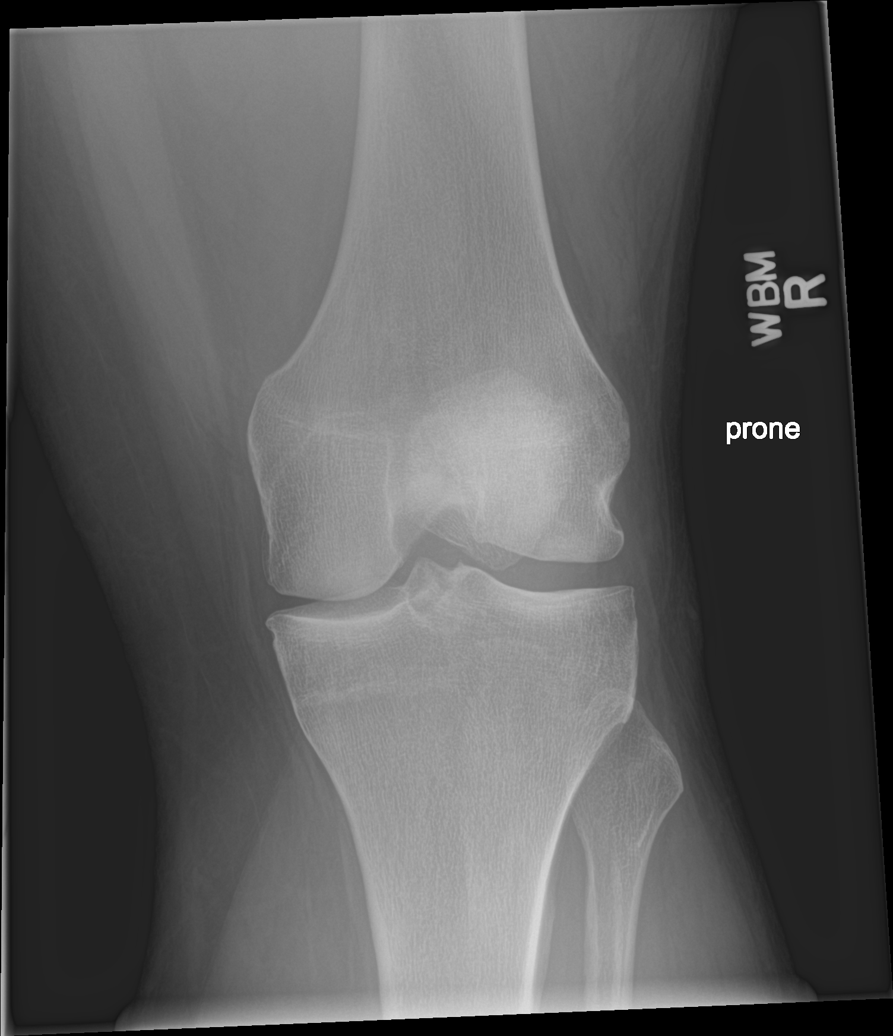

[6 of 6 positions shown; findings below may reference images not displayed]

FINDINGS: Standing frontal, standing oblique, prone tunnel, lateral, and
sunrise patellar images obtained. There is no fracture or
dislocation. No joint effusion. There is moderate joint space
narrowing medially. There is spurring medially. Other joint
compartments appear unremarkable. No erosive change.
IMPRESSION: Osteoarthritic change medially.  No fracture or joint effusion.

## 2018-11-18 ENCOUNTER — Ambulatory Visit
Admission: EM | Admit: 2018-11-18 | Discharge: 2018-11-18 | Disposition: A | Discharge status: Home / Self Care | Payer: BLUE CROSS/BLUE SHIELD | Attending: Internal Medicine | Admitting: Internal Medicine

## 2018-11-18 ENCOUNTER — Other Ambulatory Visit: Payer: Self-pay

## 2018-11-18 DIAGNOSIS — M5432 Sciatica, left side: Secondary | ICD-10-CM

## 2018-11-18 MED ORDER — IBUPROFEN 600 MG PO TABS: 600.0000 mg | ORAL_TABLET | Freq: Four times a day (QID) | ORAL | 0 refills | Status: DC | PRN

## 2018-11-18 MED ORDER — KETOROLAC TROMETHAMINE 60 MG/2ML IM SOLN
60.0000 mg | Freq: Once | INTRAMUSCULAR | Status: AC
  Administered 2018-11-18: 60 mg via INTRAMUSCULAR

## 2018-11-18 NOTE — ED Triage Notes (Signed)
Reports that yesterday, he began having lower back pain that has a sharp and shooting sensation down left leg.  Pt alert and oriented X4, cooperative, RR even and unlabored, color WNL. Pt in NAD.

## 2018-11-18 NOTE — ED Provider Notes (Signed)
[facility]    CSN: [id number] Arrival date & time: 11/18/18  1106      History   Chief Complaint Chief Complaint  Patient presents with  . Back Pain    HPI Howard Bauer is a 33 y.o. male with history of gastroesophageal reflux disease comes to urgent care with complaint of sharp severe back pain of 1 day duration.  Symptoms started yesterday and has not improved.  Pain is sharp in nature, currently severe scale of 8/10.  Aggravated by movement.  Relieved by sitting still.  Patient denies any trauma to the back.  Pain radiating to the left leg.  No numbness or tingling in the left leg.  No weakness in the left leg.  This is the first episode that patient has had.  He is a Airline pilot. Past Medical History:  Diagnosis Date  . GERD (gastroesophageal reflux disease)    in past  . Obesity   . Peritonsillar abscess 11 20 16     Patient Active Problem List   Diagnosis Date Noted  . Chronic pain of right knee 05/30/2016  . Body mass index (BMI) of 37.0 to 37.9 in adult 05/30/2016  . Current tobacco use 02/03/2013    History reviewed. No pertinent surgical history.     Home Medications    Prior to Admission medications   Medication Sig Start Date End Date Taking? Authorizing Provider  ibuprofen (ADVIL) 600 MG tablet Take 1 tablet (600 mg total) by mouth every 6 (six) hours as needed. 11/18/18   LampteyMyrene Galas, MD    Family History Family History  Problem Relation Age of Onset  . Heart disease Mother   . Cancer Mother        ovarian cancer  . Heart disease Father   . Stroke Father   . Diabetes Sister        type 1  . Cancer Maternal Grandmother        liver cancer    Social History Social History   Tobacco Use  . Smoking status: Current Every Day Smoker    Packs/day: 1.00    Types: Cigarettes    Last attempt to quit: 06/20/2016    Years since quitting: 2.4  . Smokeless tobacco: Former Network engineer Use Topics  . Alcohol use: No  .  Drug use: No     Allergies   Patient has no known allergies.   Review of Systems Review of Systems  Constitutional: Positive for activity change.  HENT: Negative.   Respiratory: Negative.   Cardiovascular: Negative.   Gastrointestinal: Negative.   Genitourinary: Negative.   Musculoskeletal: Positive for arthralgias and back pain. Negative for gait problem, joint swelling, neck pain and neck stiffness.  Skin: Negative.   Neurological: Negative.   Psychiatric/Behavioral: Negative.      Physical Exam Triage Vital Signs ED Triage Vitals [11/18/18 1119]  Enc Vitals Group     BP 118/72     Pulse Rate 77     Resp 16     Temp 98 F (36.7 C)     Temp Source Oral     SpO2 100 %     Weight 284 lb (128.8 kg)     Height 6' (1.829 m)     Head Circumference      Peak Flow      Pain Score 4     Pain Loc      Pain Edu?      Excl. in [place]?  No data found.  Updated Vital Signs BP 118/72 (BP Location: Left Arm)   Pulse 77   Temp 98 F (36.7 C) (Oral)   Resp 16   Ht 6' (1.829 m)   Wt 128.8 kg   SpO2 100%   BMI 38.52 kg/m   Visual Acuity Right Eye Distance:   Left Eye Distance:   Bilateral Distance:    Right Eye Near:   Left Eye Near:    Bilateral Near:     Physical Exam Constitutional:      General: He is in acute distress.     Appearance: He is obese. He is not ill-appearing.  Neck:     Musculoskeletal: Normal range of motion. No neck rigidity or muscular tenderness.  Cardiovascular:     Rate and Rhythm: Normal rate and regular rhythm.     Pulses: Normal pulses.     Heart sounds: Normal heart sounds.  Pulmonary:     Effort: Pulmonary effort is normal.     Breath sounds: Normal breath sounds.  Abdominal:     General: Bowel sounds are normal. There is no distension.     Palpations: Abdomen is soft.     Tenderness: There is no guarding or rebound.  Musculoskeletal:        General: No swelling, tenderness, deformity or signs of injury.  Skin:    General:  Skin is warm.     Capillary Refill: Capillary refill takes less than 2 seconds.     Findings: No bruising or erythema.  Neurological:     General: No focal deficit present.     Mental Status: He is alert and oriented to person, place, and time.      UC Treatments / Results  Labs (all labs ordered are listed, but only abnormal results are displayed) Labs Reviewed - No data to display  EKG   Radiology No results found.  Procedures Procedures (including critical care time)  Medications Ordered in UC Medications  ketorolac (TORADOL) injection 60 mg (60 mg Intramuscular Given 11/18/18 1133)    Initial Impression / Assessment and Plan / UC Course  I have reviewed the triage vital signs and the nursing notes.  Pertinent labs & imaging results that were available during my care of the patient were reviewed by me and considered in my medical decision making (see chart for details).     1. Sciatica of left side: Toradol IM X1 Ibuprofen 600 mg every 6 hours as needed for pain Heat therapy Gentle stretches. If patient's symptoms worsens or if he develops any weakness in the lower extremity or numbness in the lower extremity he is advised to return to be reevaluated.  Final Clinical Impressions(s) / UC Diagnoses   Final diagnoses:  Sciatica of left side   Discharge Instructions   None    ED Prescriptions    Medication Sig Dispense Auth. Provider   ibuprofen (ADVIL) 600 MG tablet Take 1 tablet (600 mg total) by mouth every 6 (six) hours as needed. 30 tablet Senaida Chilcote, Britta Mccreedy, MD     PDMP not reviewed this encounter.   Merrilee Jansky, MD 11/18/18 1140

## 2018-12-02 DIAGNOSIS — Z20828 Contact with and (suspected) exposure to other viral communicable diseases: Secondary | ICD-10-CM | POA: Diagnosis not present

## 2019-07-21 DIAGNOSIS — H52223 Regular astigmatism, bilateral: Secondary | ICD-10-CM | POA: Diagnosis not present

## 2021-02-03 ENCOUNTER — Encounter: Payer: Self-pay | Admitting: Family Medicine

## 2021-02-03 ENCOUNTER — Other Ambulatory Visit: Payer: Self-pay

## 2021-02-03 ENCOUNTER — Ambulatory Visit (INDEPENDENT_AMBULATORY_CARE_PROVIDER_SITE_OTHER): Payer: BC Managed Care – PPO | Admitting: Family Medicine

## 2021-02-03 VITALS — BP 124/86 | HR 80 | Ht 72.0 in | Wt 334.0 lb

## 2021-02-03 DIAGNOSIS — Z1159 Encounter for screening for other viral diseases: Secondary | ICD-10-CM | POA: Diagnosis not present

## 2021-02-03 DIAGNOSIS — Z0000 Encounter for general adult medical examination without abnormal findings: Secondary | ICD-10-CM | POA: Diagnosis not present

## 2021-02-03 DIAGNOSIS — E559 Vitamin D deficiency, unspecified: Secondary | ICD-10-CM

## 2021-02-03 DIAGNOSIS — Z13220 Encounter for screening for lipoid disorders: Secondary | ICD-10-CM

## 2021-02-03 DIAGNOSIS — Z23 Encounter for immunization: Secondary | ICD-10-CM | POA: Diagnosis not present

## 2021-02-03 DIAGNOSIS — Z114 Encounter for screening for human immunodeficiency virus [HIV]: Secondary | ICD-10-CM

## 2021-02-03 NOTE — Patient Instructions (Signed)
-   Obtain fasting labs with orders provided (can have water or black coffee but otherwise no food or drink x 8 hours before labs) - Review information provided - Attend eye doctor annually, dentist every 6 months, work towards or maintain 30 minutes of moderate intensity physical activity at least 5 days per week, and consume a balanced diet - Return in 2 weeks for follow-up - Contact us for any questions between now and then

## 2021-02-03 NOTE — Progress Notes (Signed)
Annual Physical Exam Visit  Patient Information:  Patient ID: Howard Bauer, male DOB: 05-22-1985 Age: 35 y.o. MRN: [id number]   Subjective:   CC: Annual Physical Exam  HPI:  Howard Bauer is here for their annual physical.  I reviewed the past medical history, family history, social history, surgical history, and allergies today and changes were made as necessary.  Please see the problem list section below for additional details.  Past Medical History: Past Medical History:  Diagnosis Date   GERD (gastroesophageal reflux disease)    in past   Obesity    Peritonsillar abscess 11 20 16    Past Surgical History: History reviewed. No pertinent surgical history. Family History: Family History  Problem Relation Age of Onset   Heart disease Mother    Cancer Mother        ovarian cancer   Heart disease Father    Stroke Father    Lung disease Father    Diabetes Sister        type 1   Cancer Maternal Grandmother        liver cancer   Allergies: No Known Allergies Health Maintenance: Health Maintenance  Topic Date Due   Pneumococcal Vaccine 44-79 Years old (1 - PCV) Never done   Hepatitis C Screening  Never done   COVID-19 Vaccine (3 - Booster) 01/19/2020   TETANUS/TDAP  02/08/2025   INFLUENZA VACCINE  Completed   HIV Screening  Completed   HPV VACCINES  Aged Out    HM Colonoscopy     This patient has no relevant Health Maintenance data.      Medications: No current outpatient medications on file prior to visit.   No current facility-administered medications on file prior to visit.    Review of Systems: No headache, visual changes, nausea, vomiting, diarrhea, constipation, dizziness, abdominal pain, skin rash, fevers, chills, night sweats, swollen lymph nodes, weight loss, chest pain, body aches, joint swelling, muscle aches, shortness of breath, mood changes, visual or auditory hallucinations reported.  Objective:   Vitals:    02/03/21 0933  BP: 124/86  Pulse: 80  SpO2: 99%   Vitals:   02/03/21 0933  Weight: (!) 334 lb (151.5 kg)  Height: 6' (1.829 m)   Body mass index is 45.3 kg/m.  General: Well Developed, well nourished, and in no acute distress.  Neuro: Alert and oriented x3, extra-ocular muscles intact, sensation grossly intact. Cranial nerves II through XII are grossly intact, motor, sensory, and coordinative functions are intact. HEENT: Normocephalic, atraumatic, pupils equal round reactive to light, neck supple, no masses, no lymphadenopathy, thyroid nonpalpable. Oropharynx, nasopharynx, external ear canals are unremarkable. Skin: Warm and dry, no rashes noted.  Cardiac: Regular rate and rhythm, no murmurs rubs or gallops. No peripheral edema. Pulses symmetric. Respiratory: Clear to auscultation bilaterally. Not using accessory muscles, speaking in full sentences.  Abdominal: Soft, nontender, nondistended, positive bowel sounds, no masses, no organomegaly. Musculoskeletal: Shoulder, elbow, wrist, hip, knee, ankle stable, and with full range of motion.  Impression and Recommendations:   The patient was counselled, risk factors were discussed, and anticipatory guidance given.  Annual physical exam Annual examination completed, risk stratification labs ordered, anticipatory guidance provided.  Annual influenza vaccine administered.  Tobacco cessation counseling took place, he will return in 2 weeks for further discussions.  We will follow labs once resulted.   Orders & Medications Medications: No orders of the defined types were placed in this encounter.  Orders Placed This Encounter  Procedures   Flu Vaccine QUAD 77mo+IM (Fluarix, Fluzone & Alfiuria Quad PF)   TSH Rfx on Abnormal to Free T4   CBC with Differential/Platelet   Comprehensive metabolic panel   Lipid panel   Apo A1 + B + Ratio   VITAMIN D 25 Hydroxy (Vit-D Deficiency, Fractures)   Hepatitis C antibody   HIV Antibody (routine  testing w rflx)     Return in about 2 weeks (around 02/17/2021) for smoking cessation discussion.    Jerrol Banana, MD   Primary Care Sports Medicine [facility]

## 2021-02-03 NOTE — Assessment & Plan Note (Signed)
Annual examination completed, risk stratification labs ordered, anticipatory guidance provided.  Annual influenza vaccine administered.  Tobacco cessation counseling took place, he will return in 2 weeks for further discussions.  We will follow labs once resulted.

## 2021-02-04 DIAGNOSIS — Z0000 Encounter for general adult medical examination without abnormal findings: Secondary | ICD-10-CM | POA: Diagnosis not present

## 2021-02-04 DIAGNOSIS — Z114 Encounter for screening for human immunodeficiency virus [HIV]: Secondary | ICD-10-CM | POA: Diagnosis not present

## 2021-02-04 DIAGNOSIS — Z13220 Encounter for screening for lipoid disorders: Secondary | ICD-10-CM | POA: Diagnosis not present

## 2021-02-04 DIAGNOSIS — E559 Vitamin D deficiency, unspecified: Secondary | ICD-10-CM | POA: Diagnosis not present

## 2021-02-04 DIAGNOSIS — Z1159 Encounter for screening for other viral diseases: Secondary | ICD-10-CM | POA: Diagnosis not present

## 2021-02-05 LAB — CBC WITH DIFFERENTIAL/PLATELET
Basophils Absolute: 0 10*3/uL (ref 0.0–0.2)
Basos: 0 %
EOS (ABSOLUTE): 0.1 10*3/uL (ref 0.0–0.4)
Eos: 1 %
Hematocrit: 45.4 % (ref 37.5–51.0)
Hemoglobin: 14.4 g/dL (ref 13.0–17.7)
Immature Grans (Abs): 0 10*3/uL (ref 0.0–0.1)
Immature Granulocytes: 1 %
Lymphocytes Absolute: 1.6 10*3/uL (ref 0.7–3.1)
Lymphs: 19 %
MCH: 26.3 pg — ABNORMAL LOW (ref 26.6–33.0)
MCHC: 31.7 g/dL (ref 31.5–35.7)
MCV: 83 fL (ref 79–97)
Monocytes Absolute: 0.6 10*3/uL (ref 0.1–0.9)
Monocytes: 7 %
Neutrophils Absolute: 6.3 10*3/uL (ref 1.4–7.0)
Neutrophils: 72 %
Platelets: 219 10*3/uL (ref 150–450)
RBC: 5.48 x10E6/uL (ref 4.14–5.80)
RDW: 13.3 % (ref 11.6–15.4)
WBC: 8.7 10*3/uL (ref 3.4–10.8)

## 2021-02-05 LAB — LIPID PANEL
Chol/HDL Ratio: 4.5 ratio (ref 0.0–5.0)
Cholesterol, Total: 163 mg/dL (ref 100–199)
HDL: 36 mg/dL — ABNORMAL LOW (ref >39)
LDL Chol Calc (NIH): 94 mg/dL (ref 0–99)
Triglycerides: 193 mg/dL — ABNORMAL HIGH (ref 0–149)
VLDL Cholesterol Cal: 33 mg/dL (ref 5–40)

## 2021-02-05 LAB — COMPREHENSIVE METABOLIC PANEL
ALT: 26 IU/L (ref 0–44)
AST: 16 IU/L (ref 0–40)
Albumin/Globulin Ratio: 1.6 (ref 1.2–2.2)
Albumin: 4.4 g/dL (ref 4.0–5.0)
Alkaline Phosphatase: 79 IU/L (ref 44–121)
BUN/Creatinine Ratio: 16 (ref 9–20)
BUN: 15 mg/dL (ref 6–20)
Bilirubin Total: 0.3 mg/dL (ref 0.0–1.2)
CO2: 23 mmol/L (ref 20–29)
Calcium: 9.1 mg/dL (ref 8.7–10.2)
Chloride: 100 mmol/L (ref 96–106)
Creatinine, Ser: 0.93 mg/dL (ref 0.76–1.27)
Globulin, Total: 2.8 g/dL (ref 1.5–4.5)
Glucose: 83 mg/dL (ref 70–99)
Potassium: 4.4 mmol/L (ref 3.5–5.2)
Sodium: 139 mmol/L (ref 134–144)
Total Protein: 7.2 g/dL (ref 6.0–8.5)
eGFR: 109 mL/min/{1.73_m2} (ref >59)

## 2021-02-05 LAB — HIV ANTIBODY (ROUTINE TESTING W REFLEX): HIV Screen 4th Generation wRfx: NONREACTIVE

## 2021-02-05 LAB — HEPATITIS C ANTIBODY: Hep C Virus Ab: <0.1 s/co ratio (ref 0.0–0.9)

## 2021-02-05 LAB — APO A1 + B + RATIO
Apolipo. B/A-1 Ratio: 0.7 ratio (ref 0.0–0.7)
Apolipoprotein A-1: 117 mg/dL (ref 101–178)
Apolipoprotein B: 79 mg/dL (ref <90)

## 2021-02-05 LAB — TSH RFX ON ABNORMAL TO FREE T4: TSH: 1.8 u[IU]/mL (ref 0.450–4.500)

## 2021-02-05 LAB — VITAMIN D 25 HYDROXY (VIT D DEFICIENCY, FRACTURES): Vit D, 25-Hydroxy: 35.2 ng/mL (ref 30.0–100.0)

## 2021-02-17 ENCOUNTER — Ambulatory Visit (INDEPENDENT_AMBULATORY_CARE_PROVIDER_SITE_OTHER): Payer: BC Managed Care – PPO | Admitting: Family Medicine

## 2021-02-17 ENCOUNTER — Other Ambulatory Visit: Payer: Self-pay

## 2021-02-17 ENCOUNTER — Encounter: Payer: Self-pay | Admitting: Family Medicine

## 2021-02-17 VITALS — BP 128/84 | HR 83 | Ht 72.0 in | Wt 338.0 lb

## 2021-02-17 DIAGNOSIS — Z6841 Body Mass Index (BMI) 40.0 and over, adult: Secondary | ICD-10-CM

## 2021-02-17 DIAGNOSIS — Z716 Tobacco abuse counseling: Secondary | ICD-10-CM | POA: Insufficient documentation

## 2021-02-17 DIAGNOSIS — F17200 Nicotine dependence, unspecified, uncomplicated: Secondary | ICD-10-CM

## 2021-02-17 DIAGNOSIS — E661 Drug-induced obesity: Secondary | ICD-10-CM | POA: Insufficient documentation

## 2021-02-17 MED ORDER — BUPROPION HCL ER (SR) 150 MG PO TB12: ORAL_TABLET | ORAL | 2 refills | Status: DC

## 2021-02-17 NOTE — Assessment & Plan Note (Signed)
Chronic issue with ongoing symptomatology.  Patient has transition to e-cigarettes a few years prior, did this for convenience.  His wife was able to quit "cold [place] "roughly 10 years prior.  There are fewer numbers in his workplace that have been smoking.  He is ready to quit at this time.  I reviewed various behavioral and pharmacotherapeutic options.  He has trialed Chantix in the past but could not tolerate the vivid dreams.  He briefly and sporadically used bupropion and cannot recall efficacy.  I did discuss the importance of setting a quit date, limiting scenarios where he is more likely to fall back into smoking, replacing behavior/behavior modification, and is agreed to quit over the next 1-2 weeks, start bupropion 150 mg x 3 days, then advance to 150 mg twice daily.  Have the patient follow-up in 6 weeks for reevaluation.

## 2021-02-17 NOTE — Assessment & Plan Note (Signed)
Discussion regarding tobacco cessation today. See additional assessment(s) for plan details.

## 2021-02-17 NOTE — Progress Notes (Signed)
Primary Care / Sports Medicine Office Visit  Patient Information:  Patient ID: Howard Bauer, male DOB: 1985/04/18 Age: 35 y.o. MRN: [id number]   Howard Bauer is a pleasant 35 y.o. male presenting with the following:  Chief Complaint  Patient presents with   Follow-up   Nicotine Dependence    Discuss smoking cessation; currently smoking e-cigarettes only    Patient Active Problem List   Diagnosis Date Noted   Tobacco dependence 02/17/2021   Encounter for smoking cessation counseling 02/17/2021   Class 3 severe obesity due to excess calories without serious comorbidity with body mass index (BMI) of 45.0 to 49.9 in adult [facility]) 02/17/2021   Annual physical exam 02/03/2021   Chronic pain of right knee 05/30/2016   Body mass index (BMI) of 37.0 to 37.9 in adult 05/30/2016   Current tobacco use 02/03/2013    Vitals:   02/17/21 0804  BP: 128/84  Pulse: 83  SpO2: 97%   Vitals:   02/17/21 0804  Weight: (!) 338 lb (153.3 kg)  Height: 6' (1.829 m)   Body mass index is 45.84 kg/m.  No results found.   Independent interpretation of notes and tests performed by another provider:   None  Procedures performed:   None  Pertinent History, Exam, Impression, and Recommendations:   Class 3 severe obesity due to excess calories without serious comorbidity with body mass index (BMI) of 45.0 to 49.9 in adult [facility]) This a chronic issue with ongoing symptomatology.  Patient with BMI 45.84, has tried various methods in the past for weight loss with dietary modifications, although achieve some weight loss but this was not sustainable.  He is end-stage where he is amenable to additional methods.  We spent a long duration of time reviewing alternate, generating a caloric deficit, addressing behavioral component of eating, making realistic, sustainable, and small changes.  Lastly, we reviewed the role of exercise in regards to weight loss.  At this stage she has  already made progress by choosing healthier breakfast options, he will pick 1-2 additional changes to make in his day-to-day diet (i.e. eliminating sodas).  Additionally, he is considering moderate intensity exercise on a regular basis, ideally after meals.  Tobacco dependence Chronic issue with ongoing symptomatology.  Patient has transition to e-cigarettes a few years prior, did this for convenience.  His wife was able to quit "cold [place] "roughly 10 years prior.  There are fewer numbers in his workplace that have been smoking.  He is ready to quit at this time.  I reviewed various behavioral and pharmacotherapeutic options.  He has trialed Chantix in the past but could not tolerate the vivid dreams.  He briefly and sporadically used bupropion and cannot recall efficacy.  I did discuss the importance of setting a quit date, limiting scenarios where he is more likely to fall back into smoking, replacing behavior/behavior modification, and is agreed to quit over the next 1-2 weeks, start bupropion 150 mg x 3 days, then advance to 150 mg twice daily.  Have the patient follow-up in 6 weeks for reevaluation.  Encounter for smoking cessation counseling Discussion regarding tobacco cessation today. See additional assessment(s) for plan details.   I provided a total time of 46 minutes including both face-to-face and non-face-to-face time on 02/17/2021 inclusive of time utilized for medical chart review, information gathering, care coordination with staff, and documentation completion.  Specifically, we reviewed pharmacologic versus nonpharmacologic options for smoking cessation, previous methods, planned quit date, dosing considerations  and guidelines of new medication, and importance of day-to-day modifications to limit scenarios where he will be inclined to resume smoking.  Additionally, weight loss methods both pharmacologic and nonpharmacological reviewed, importance of sustainable dietary changes, role of  exercise, and need for close follow-up.  Orders & Medications Meds ordered this encounter  Medications   DISCONTD: buPROPion (WELLBUTRIN SR) 150 MG 12 hr tablet    Sig: Take 1 tablet PO daily for 3 days, then 150 mg PO twice a day Start 1 to 2 weeks before quit date    Dispense:  60 tablet    Refill:  2   buPROPion (WELLBUTRIN SR) 150 MG 12 hr tablet    Sig: Take 1 tablet PO daily for 3 days, then 150 mg PO twice a day Start 1 to 2 weeks before quit date    Dispense:  60 tablet    Refill:  2   No orders of the defined types were placed in this encounter.    Return in about 6 weeks (around 03/31/2021).     Jerrol Banana, MD   Primary Care Sports Medicine [facility]

## 2021-02-17 NOTE — Assessment & Plan Note (Signed)
This a chronic issue with ongoing symptomatology.  Patient with BMI 45.84, has tried various methods in the past for weight loss with dietary modifications, although achieve some weight loss but this was not sustainable.  He is end-stage where he is amenable to additional methods.  We spent a long duration of time reviewing alternate, generating a caloric deficit, addressing behavioral component of eating, making realistic, sustainable, and small changes.  Lastly, we reviewed the role of exercise in regards to weight loss.  At this stage she has already made progress by choosing healthier breakfast options, he will pick 1-2 additional changes to make in his day-to-day diet (i.e. eliminating sodas).  Additionally, he is considering moderate intensity exercise on a regular basis, ideally after meals.

## 2021-02-22 ENCOUNTER — Encounter: Payer: Self-pay | Admitting: Family Medicine

## 2021-02-22 NOTE — Telephone Encounter (Signed)
Paper printed and put in box.  KP

## 2021-02-24 NOTE — Telephone Encounter (Signed)
As we need the standard physical requirements to deem competence, I am unable to sign off at this time. If this is made available to [place], pending ability to complete those tasks, I can sign off.  JJM

## 2021-03-11 ENCOUNTER — Other Ambulatory Visit: Payer: Self-pay | Admitting: Family Medicine

## 2021-03-11 DIAGNOSIS — F17200 Nicotine dependence, unspecified, uncomplicated: Secondary | ICD-10-CM

## 2021-03-11 NOTE — Telephone Encounter (Signed)
Please advise if okay to send 90 day supply? 

## 2021-03-11 NOTE — Telephone Encounter (Signed)
Requested medication (s) are due for refill today - no  Requested medication (s) are on the active medication list -yes  Future visit scheduled -yes  Last refill: 02/17/21 #60 2RF  Notes to clinic: Request 90 day supply- requires changes to original Rx- sent for review   Requested Prescriptions  Pending Prescriptions Disp Refills   buPROPion (WELLBUTRIN SR) 150 MG 12 hr tablet [Pharmacy Med Name: BUPROPION HCL SR 150 MG TABLET] 180 tablet 1    Sig: TAKE 1 TABLET DAILY FOR 3 DAYS, THEN TWICE A DAY START 1 TO 2 WEEKS BEFORE QUIT DATE     Psychiatry: Antidepressants - bupropion Passed - 03/11/2021  8:30 AM      Passed - Last BP in normal range    BP Readings from Last 1 Encounters:  02/17/21 128/84          Passed - Valid encounter within last 6 months    Recent Outpatient Visits           3 weeks ago Tobacco dependence   [facility] Jerrol Banana, MD   1 month ago Annual physical exam   [facility] Jerrol Banana, MD   3 years ago Encounter for annual physical exam   [facility] Kyung Rudd, Alison Stalling, NP   4 years ago Encounter for smoking cessation counseling   [facility] Kyung Rudd, Alison Stalling, NP   4 years ago Annual physical exam   [facility] Kyung Rudd, Alison Stalling, NP       Future Appointments             In 2 weeks Ashley Royalty, Ocie Bob, MD [facility], [facility]               Requested Prescriptions  Pending Prescriptions Disp Refills   buPROPion (WELLBUTRIN SR) 150 MG 12 hr tablet [Pharmacy Med Name: BUPROPION HCL SR 150 MG TABLET] 180 tablet 1    Sig: TAKE 1 TABLET DAILY FOR 3 DAYS, THEN TWICE A DAY START 1 TO 2 WEEKS BEFORE QUIT DATE     Psychiatry: Antidepressants - bupropion Passed - 03/11/2021  8:30 AM      Passed - Last BP in normal range    BP Readings from Last 1 Encounters:  02/17/21 128/84          Passed - Valid encounter within last 6 months    Recent  Outpatient Visits           3 weeks ago Tobacco dependence   [facility] Jerrol Banana, MD   1 month ago Annual physical exam   [facility] Jerrol Banana, MD   3 years ago Encounter for annual physical exam   [facility] Kyung Rudd, Alison Stalling, NP   4 years ago Encounter for smoking cessation counseling   [facility] Kyung Rudd, Alison Stalling, NP   4 years ago Annual physical exam   [facility] Kyung Rudd, Alison Stalling, NP       Future Appointments             In 2 weeks Ashley Royalty, Ocie Bob, MD [facility], [facility]

## 2021-03-31 ENCOUNTER — Ambulatory Visit: Payer: BC Managed Care – PPO | Admitting: Family Medicine

## 2021-03-31 ENCOUNTER — Other Ambulatory Visit: Payer: Self-pay

## 2021-03-31 ENCOUNTER — Encounter: Payer: Self-pay | Admitting: Family Medicine

## 2021-03-31 DIAGNOSIS — F17200 Nicotine dependence, unspecified, uncomplicated: Secondary | ICD-10-CM | POA: Diagnosis not present

## 2021-03-31 NOTE — Assessment & Plan Note (Signed)
Patient was able to completely quit from electronic cigarettes on 02/24/2021 and has been tolerating Wellbutrin well, has noted increased gas/bloating, mild constipation.  He has been using chewing gum to medicate cravings and this has been effective per patient.  Overall patient is doing very well in his tobacco cessation, at this stage continue Wellbutrin for full course (90 days).  I will have him return in 2 months after he has completed this course for reassessment.  Over the interim have advised supportive measures to address his gas/bloating/constipation.  Chronic condition, stable, OTC medications

## 2021-03-31 NOTE — Patient Instructions (Signed)
-   Continue Wellbutrin twice daily until Rx completed - Continue healthy lifestyle changes as you have been doing and methods to control cravings (chewing, etc.) - Recommend increased free water intake, dietary fiber, and over-the-counter fiber supplement to address gas/bloating/constipation - Return for follow-up in 2 months, contact our office for any questions/concerns between now and then

## 2021-03-31 NOTE — Progress Notes (Signed)
°  ° °  Primary Care / Sports Medicine Office Visit  Patient Information:  Patient ID: Truman Aceituno, male DOB: 1985-05-30 Age: 36 y.o. MRN: [id number]   Kendra Woolford is a pleasant 36 y.o. male presenting with the following:  Chief Complaint  Patient presents with   Nicotine Dependence    Tolerating bupropion 150 mg twice a day well; has quit smoking as of 02/24/2021    Vitals:   03/31/21 0755  BP: 124/84  Pulse: 76  SpO2: 97%   Vitals:   03/31/21 0755  Weight: (!) 331 lb (150.1 kg)  Height: 6' (1.829 m)   Body mass index is 44.89 kg/m.  No results found.   Independent interpretation of notes and tests performed by another provider:   None  Procedures performed:   None  Pertinent History, Exam, Impression, and Recommendations:   Tobacco dependence Patient was able to completely quit from electronic cigarettes on 02/24/2021 and has been tolerating Wellbutrin well, has noted increased gas/bloating, mild constipation.  He has been using chewing gum to medicate cravings and this has been effective per patient.  Overall patient is doing very well in his tobacco cessation, at this stage continue Wellbutrin for full course (90 days).  I will have him return in 2 months after he has completed this course for reassessment.  Over the interim have advised supportive measures to address his gas/bloating/constipation.  Chronic condition, stable, OTC medications   Orders & Medications No orders of the defined types were placed in this encounter.  No orders of the defined types were placed in this encounter.    Return in about 2 months (around 05/29/2021).     Jerrol Banana, MD   Primary Care Sports Medicine [facility]

## 2021-04-25 DIAGNOSIS — H52223 Regular astigmatism, bilateral: Secondary | ICD-10-CM | POA: Diagnosis not present

## 2021-06-06 ENCOUNTER — Ambulatory Visit (INDEPENDENT_AMBULATORY_CARE_PROVIDER_SITE_OTHER): Payer: BC Managed Care – PPO | Admitting: Family Medicine

## 2021-06-06 ENCOUNTER — Encounter: Payer: Self-pay | Admitting: Family Medicine

## 2021-06-06 VITALS — BP 136/86 | HR 74 | Ht 72.0 in | Wt 335.0 lb

## 2021-06-06 DIAGNOSIS — F17200 Nicotine dependence, unspecified, uncomplicated: Secondary | ICD-10-CM

## 2021-06-06 DIAGNOSIS — Z6841 Body Mass Index (BMI) 40.0 and over, adult: Secondary | ICD-10-CM | POA: Diagnosis not present

## 2021-06-06 MED ORDER — WEGOVY 0.5 MG/0.5ML ~~LOC~~ SOAJ: 0.5000 mg | SUBCUTANEOUS | 0 refills | Status: DC

## 2021-06-06 NOTE — Assessment & Plan Note (Signed)
Patient has been successful in maintaining tobacco cessation, he self discontinued Wellbutrin citing GI intolerance roughly 1 month prior without any worsened symptoms/cravings.  We will continue monitor this chronic and stable condition. ?

## 2021-06-06 NOTE — Assessment & Plan Note (Signed)
Consistent BMI elevation noted, interval weight gain from last visit.  We reviewed pharmacologic and nonpharmacologic strategies and he is amenable to initiation of Wegovy.  Sample starter pack provided, the second month 0.5 mg dose has been sent to his pharmacy.  If covered, plan to coordinate 73-month follow-up to assess response, tolerability, and need for further titration. ? ?Chronic condition, symptomatic, Rx management ?

## 2021-06-06 NOTE — Progress Notes (Signed)
?  ? ?  Primary Care / Sports Medicine Office Visit ? ?Patient Information:  ?Patient ID: Howard Bauer, male DOB: 03/09/1985 Age: 36 y.o. MRN: [id number]  ? ?Howard Bauer is a pleasant 36 y.o. male presenting with the following: ? ?Chief Complaint  ?Patient presents with  ? Follow-up  ?  Still not smoking, feels better.   ? ? ?Vitals:  ? 06/06/21 0750  ?BP: 136/86  ?Pulse: 74  ?SpO2: 97%  ? ?Vitals:  ? 06/06/21 0750  ?Weight: (!) 335 lb (152 kg)  ?Height: 6' (1.829 m)  ? ?Body mass index is 45.43 kg/m?. ? ?No results found.  ? ?Independent interpretation of notes and tests performed by another provider:  ? ?None ? ?Procedures performed:  ? ?None ? ?Pertinent History, Exam, Impression, and Recommendations:  ? ?Tobacco dependence ?Patient has been successful in maintaining tobacco cessation, he self discontinued Wellbutrin citing GI intolerance roughly 1 month prior without any worsened symptoms/cravings.  We will continue monitor this chronic and stable condition. ? ?Class 3 severe obesity due to excess calories without serious comorbidity with body mass index (BMI) of 45.0 to 49.9 in adult [facility]) ?Consistent BMI elevation noted, interval weight gain from last visit.  We reviewed pharmacologic and nonpharmacologic strategies and he is amenable to initiation of Wegovy.  Sample starter pack provided, the second month 0.5 mg dose has been sent to his pharmacy.  If covered, plan to coordinate 6-month follow-up to assess response, tolerability, and need for further titration. ? ?Chronic condition, symptomatic, Rx management  ? ?Orders & Medications ?Meds ordered this encounter  ?Medications  ? DISCONTD: WEGOVY 0.5 MG/0.5ML SOAJ  ?  Sig: Inject 0.5 mg into the skin once a week. Use this dose for 1 month (4 shots) and then increase to next higher dose.  ?  Dispense:  2 mL  ?  Refill:  0  ? WEGOVY 0.5 MG/0.5ML SOAJ  ?  Sig: Inject 0.5 mg into the skin once a week. Use this dose for 1 month (4 shots) and  then increase to next higher dose.  ?  Dispense:  2 mL  ?  Refill:  0  ? ?No orders of the defined types were placed in this encounter. ?  ? ?No follow-ups on file.  ?  ? ?Jerrol Banana, MD ? ? Primary Care Sports Medicine ?[facility] ?[facility]  ? ?

## 2021-06-06 NOTE — Telephone Encounter (Signed)
2 month follow up scheduled. ? ?KP ?

## 2021-06-06 NOTE — Telephone Encounter (Signed)
FYI- Please review. ? ?KP

## 2021-06-12 ENCOUNTER — Other Ambulatory Visit: Payer: Self-pay | Admitting: Family Medicine

## 2021-06-12 DIAGNOSIS — F17200 Nicotine dependence, unspecified, uncomplicated: Secondary | ICD-10-CM

## 2021-06-13 NOTE — Telephone Encounter (Signed)
Discontinued 06/06/21. Not on current med profile. ?Requested Prescriptions  ?Pending Prescriptions Disp Refills  ?? buPROPion (WELLBUTRIN SR) 150 MG 12 hr tablet [Pharmacy Med Name: BUPROPION HCL SR 150 MG TABLET] 180 tablet 0  ?  Sig: TAKE 1 TABLET BY MOUTH TWICE A DAY  ?  ? Psychiatry: Antidepressants - bupropion Passed - 06/12/2021  1:30 AM  ?  ?  Passed - Cr in normal range and within 360 days  ?  Creat  ?Date Value Ref Range Status  ?12/21/2017 1.04 0.60 - 1.35 mg/dL Final  ? ?Creatinine, Ser  ?Date Value Ref Range Status  ?02/04/2021 0.93 0.76 - 1.27 mg/dL Final  ?   ?  ?  Passed - AST in normal range and within 360 days  ?  AST  ?Date Value Ref Range Status  ?02/04/2021 16 0 - 40 IU/L Final  ? ?SGOT(AST)  ?Date Value Ref Range Status  ?08/26/2012 24 15 - 37 Unit/L Final  ?   ?  ?  Passed - ALT in normal range and within 360 days  ?  ALT  ?Date Value Ref Range Status  ?02/04/2021 26 0 - 44 IU/L Final  ? ?SGPT (ALT)  ?Date Value Ref Range Status  ?08/26/2012 24 12 - 78 U/L Final  ?   ?  ?  Passed - Last BP in normal range  ?  BP Readings from Last 1 Encounters:  ?06/06/21 136/86  ?   ?  ?  Passed - Valid encounter within last 6 months  ?  Recent Outpatient Visits   ?      ? 1 week ago Class 3 severe obesity due to excess calories without serious comorbidity with body mass index (BMI) of 45.0 to 49.9 in adult [facility])  ? [facility] Jerrol Banana, MD  ? 2 months ago Tobacco dependence  ? [facility] Jerrol Banana, MD  ? 3 months ago Tobacco dependence  ? [facility] Jerrol Banana, MD  ? 4 months ago Annual physical exam  ? [facility] Jerrol Banana, MD  ? 3 years ago Encounter for annual physical exam  ? [facility] Galen Manila, NP  ?  ?  ?Future Appointments   ?        ? In 1 month Ashley Royalty Ocie Bob, MD [facility]  ?  ? ?  ?  ?  ? ? ?

## 2021-07-26 ENCOUNTER — Encounter: Payer: Self-pay | Admitting: Family Medicine

## 2021-07-27 ENCOUNTER — Other Ambulatory Visit: Payer: Self-pay

## 2021-07-27 DIAGNOSIS — Z6837 Body mass index (BMI) 37.0-37.9, adult: Secondary | ICD-10-CM

## 2021-07-27 MED ORDER — WEGOVY 1 MG/0.5ML ~~LOC~~ SOAJ: 1.0000 mg | SUBCUTANEOUS | 0 refills | Status: DC

## 2021-07-31 ENCOUNTER — Ambulatory Visit
Admission: EM | Admit: 2021-07-31 | Discharge: 2021-07-31 | Disposition: A | Discharge status: Home / Self Care | Payer: BC Managed Care – PPO | Attending: Physician Assistant | Admitting: Physician Assistant

## 2021-07-31 DIAGNOSIS — J0300 Acute streptococcal tonsillitis, unspecified: Secondary | ICD-10-CM | POA: Diagnosis not present

## 2021-07-31 DIAGNOSIS — R509 Fever, unspecified: Secondary | ICD-10-CM | POA: Insufficient documentation

## 2021-07-31 LAB — GROUP A STREP BY PCR: Group A Strep by PCR: DETECTED — AB

## 2021-07-31 MED ORDER — PREDNISONE 20 MG PO TABS: 40.0000 mg | ORAL_TABLET | Freq: Every day | ORAL | 0 refills | Status: AC

## 2021-07-31 MED ORDER — AMOXICILLIN-POT CLAVULANATE 875-125 MG PO TABS: 1.0000 | ORAL_TABLET | Freq: Two times a day (BID) | ORAL | 0 refills | Status: AC

## 2021-07-31 NOTE — Discharge Instructions (Signed)
-  Strep positive.  I sent Augmentin to pharmacy as well as prednisone for couple days. - If you are still having a fever after 2 days or you have worsening throat pain, swelling, neck pain, difficulty swallowing or managing your secretions you should consider going to the ER for evaluation.  I am concerned about your left tonsil being very large and has the potential to develop an abscess especially given your history.  Close monitoring. - Tylenol for pain relief and fever.  Increase rest and fluids.

## 2021-07-31 NOTE — ED Triage Notes (Signed)
Patient presents to UC for sore throat and fever -- started yesterday.

## 2021-07-31 NOTE — ED Provider Notes (Signed)
[facility]    CSN: [id number] Arrival date & time: 07/31/21  0801      History   Chief Complaint Chief Complaint  Patient presents with   Fever   Sore Throat    HPI Howard Bauer is a 36 y.o. male presenting for sore throat, swollen tonsils, fatigue, body aches and fever up to 101 degrees that started yesterday.  No cough, congestion, breathing difficulty, nausea/vomiting or diarrhea.  No sick contacts.  Has taken DayQuil and Motrin for symptoms.  History of recurrent strep throat and peritonsillar abscess a few years ago.  No other complaints.  HPI  Past Medical History:  Diagnosis Date   GERD (gastroesophageal reflux disease)    in past   Obesity    Peritonsillar abscess 11 20 16     Patient Active Problem List   Diagnosis Date Noted   Tobacco dependence 02/17/2021   Encounter for smoking cessation counseling 02/17/2021   Class 3 severe obesity due to excess calories without serious comorbidity with body mass index (BMI) of 45.0 to 49.9 in adult [facility]) 02/17/2021   Annual physical exam 02/03/2021   Chronic pain of right knee 05/30/2016   Body mass index (BMI) of 37.0 to 37.9 in adult 05/30/2016    History reviewed. No pertinent surgical history.     Home Medications    Prior to Admission medications   Medication Sig Start Date End Date Taking? Authorizing Provider  amoxicillin-clavulanate (AUGMENTIN) 875-125 MG tablet Take 1 tablet by mouth every 12 (twelve) hours for 10 days. 07/31/21 08/10/21 Yes 08/12/21, PA-C  predniSONE (DELTASONE) 20 MG tablet Take 2 tablets (40 mg total) by mouth daily for 2 days. 07/31/21 08/02/21 Yes 08/04/21, PA-C  Semaglutide-Weight Management (WEGOVY) 1 MG/0.5ML SOAJ Inject 1 mg into the skin once a week. 07/27/21  Yes 09/26/21, MD    Family History Family History  Problem Relation Age of Onset   Heart disease Mother    Cancer Mother        ovarian cancer   Heart disease Father    Stroke  Father    Lung disease Father    Diabetes Sister        type 1   Cancer Maternal Grandmother        liver cancer    Social History Social History   Tobacco Use   Smoking status: Former    Packs/day: 1.00    Types: Cigarettes, E-cigarettes    Quit date: 02/24/2021    Years since quitting: 0.4   Smokeless tobacco: Former  04/24/2021 Use: Former   Substances: Nicotine, Flavoring  Substance Use Topics   Alcohol use: Not Currently   Drug use: Never     Allergies   Patient has no known allergies.   Review of Systems Review of Systems  Constitutional:  Positive for fatigue and fever.  HENT:  Positive for sore throat. Negative for congestion, rhinorrhea and sinus pain.   Respiratory:  Negative for cough and shortness of breath.   Cardiovascular:  Negative for chest pain.  Gastrointestinal:  Negative for abdominal pain, diarrhea, nausea and vomiting.  Musculoskeletal:  Positive for myalgias.  Neurological:  Negative for weakness, light-headedness and headaches.  Hematological:  Negative for adenopathy.     Physical Exam Triage Vital Signs ED Triage Vitals  Enc Vitals Group     BP 07/31/21 0812 120/69     Pulse Rate 07/31/21 0812 91  Resp --      Temp 07/31/21 0812 98.9 F (37.2 C)     Temp Source 07/31/21 0812 Oral     SpO2 07/31/21 0812 95 %     Weight 07/31/21 0811 (!) 310 lb (140.6 kg)     Height 07/31/21 0811 6' (1.829 m)     Head Circumference --      Peak Flow --      Pain Score 07/31/21 0810 5     Pain Loc --      Pain Edu? --      Excl. in GC? --    No data found.  Updated Vital Signs BP 120/69 (BP Location: Left Arm)   Pulse 91   Temp 98.9 F (37.2 C) (Oral)   Ht 6' (1.829 m)   Wt (!) 310 lb (140.6 kg)   SpO2 95%   BMI 42.04 kg/m       Physical Exam Vitals and nursing note reviewed.  Constitutional:      General: He is not in acute distress.    Appearance: Normal appearance. He is well-developed. He is ill-appearing.  HENT:      Head: Normocephalic and atraumatic.     Nose: Nose normal.     Mouth/Throat:     Mouth: Mucous membranes are moist.     Pharynx: Oropharynx is clear. Posterior oropharyngeal erythema present.     Tonsils: Tonsillar exudate (white exudates on tonsils) present. 1+ on the right. 3+ on the left.  Eyes:     General: No scleral icterus.    Conjunctiva/sclera: Conjunctivae normal.  Cardiovascular:     Rate and Rhythm: Normal rate and regular rhythm.     Heart sounds: Normal heart sounds.  Pulmonary:     Effort: Pulmonary effort is normal. No respiratory distress.     Breath sounds: Normal breath sounds.  Musculoskeletal:     Cervical back: Neck supple.  Skin:    General: Skin is warm and dry.     Capillary Refill: Capillary refill takes less than 2 seconds.  Neurological:     General: No focal deficit present.     Mental Status: He is alert. Mental status is at baseline.     Motor: No weakness.  Psychiatric:        Mood and Affect: Mood normal.        Behavior: Behavior normal.      UC Treatments / Results  Labs (all labs ordered are listed, but only abnormal results are displayed) Labs Reviewed  GROUP A STREP BY PCR - Abnormal; Notable for the following components:      Result Value   Group A Strep by PCR DETECTED (*)    All other components within normal limits    EKG   Radiology No results found.  Procedures Procedures (including critical care time)  Medications Ordered in UC Medications - No data to display  Initial Impression / Assessment and Plan / UC Course  I have reviewed the triage vital signs and the nursing notes.  Pertinent labs & imaging results that were available during my care of the patient were reviewed by me and considered in my medical decision making (see chart for details).  36 year old male presenting for fever, fatigue, body aches, sore throat and swollen tonsils.  Symptom onset last night.  Vitals normal and stable.  Patient  ill-appearing but nontoxic.  On exam he has erythema posterior pharynx and tonsils with 3+ enlarged tonsil on the left and 1+  on the right.  Both tonsils covered and whitish exudates.  Uvula is midline.  No obvious abscesses.  Chest clear to auscultation heart regular rate and rhythm.  PCR strep test performed.  Positive.  Discussed results with patient.  We will treat at this time with Augmentin and prednisone given his history of peritonsillar abscess.  Advise close monitoring and if fever is not breaking in a couple of days or he has worsening of symptoms including swelling or difficulty swallowing and managing secretions, he is to go to the emergency department.   Final Clinical Impressions(s) / UC Diagnoses   Final diagnoses:  Strep tonsillitis  Fever, unspecified     Discharge Instructions      -Strep positive.  I sent Augmentin to pharmacy as well as prednisone for couple days. - If you are still having a fever after 2 days or you have worsening throat pain, swelling, neck pain, difficulty swallowing or managing your secretions you should consider going to the ER for evaluation.  I am concerned about your left tonsil being very large and has the potential to develop an abscess especially given your history.  Close monitoring. - Tylenol for pain relief and fever.  Increase rest and fluids.     ED Prescriptions     Medication Sig Dispense Auth. Provider   amoxicillin-clavulanate (AUGMENTIN) 875-125 MG tablet Take 1 tablet by mouth every 12 (twelve) hours for 10 days. 20 tablet Eusebio FriendlyEaves, Ozzy Bohlken B, PA-C   predniSONE (DELTASONE) 20 MG tablet Take 2 tablets (40 mg total) by mouth daily for 2 days. 4 tablet Gareth MorganEaves, Glover Capano B, PA-C      PDMP not reviewed this encounter.   Shirlee Latchaves, Caily Rakers B, PA-C 07/31/21 [phone]

## 2021-08-05 ENCOUNTER — Ambulatory Visit (INDEPENDENT_AMBULATORY_CARE_PROVIDER_SITE_OTHER): Payer: BC Managed Care – PPO | Admitting: Family Medicine

## 2021-08-05 ENCOUNTER — Encounter: Payer: Self-pay | Admitting: Family Medicine

## 2021-08-05 VITALS — BP 122/84 | HR 77 | Ht 72.0 in | Wt 313.0 lb

## 2021-08-05 DIAGNOSIS — Z6841 Body Mass Index (BMI) 40.0 and over, adult: Secondary | ICD-10-CM | POA: Diagnosis not present

## 2021-08-05 MED ORDER — WEGOVY 1.7 MG/0.75ML ~~LOC~~ SOAJ: 1.7000 mg | SUBCUTANEOUS | 0 refills | Status: DC

## 2021-08-05 NOTE — Progress Notes (Signed)
     Primary Care / Sports Medicine Office Visit  Patient Information:  Patient ID: Howard Bauer, male DOB: Mar 15, 1985 Age: 36 y.o. MRN: [id number]   [name] is a pleasant 36 y.o. male presenting with the following:  Chief Complaint  Patient presents with   Weight Check    Vitals:   08/05/21 0752  BP: 122/84  Pulse: 77  SpO2: 96%   Vitals:   08/05/21 0752  Weight: (!) 313 lb (142 kg)  Height: 6' (1.829 m)   Body mass index is 42.45 kg/m.  No results found.   Independent interpretation of notes and tests performed by another provider:   None  Procedures performed:   None  Pertinent History, Exam, Impression, and Recommendations:   Problem List Items Addressed This Visit       Other   Class 3 severe obesity due to excess calories without serious comorbidity with body mass index (BMI) of 45.0 to 49.9 in adult [facility]) - Primary    Patient has tolerated initial Wegovy 0.5 mg weekly dose, has since titrated to 1 mg which she dosed for the first time this past week.  He has noted early satiety but otherwise minimal to no nausea or abdominal pain.  I did encourage utilization of Wegovy as an adjunct to diet and exercise.  I did provide information for the [facility] coach for additional input.  New Rx for 1.7 mg dose sent to pharmacy, he will start this after 4 weeks total of 1 mg weekly dosing.      Relevant Medications   WEGOVY 1.7 MG/0.75ML SOAJ     Orders & Medications Meds ordered this encounter  Medications   WEGOVY 1.7 MG/0.75ML SOAJ    Sig: Inject 1.7 mg into the skin once a week. Use this dose for 1 month (4 shots) and then increase to next higher dose.    Dispense:  3 mL    Refill:  0   No orders of the defined types were placed in this encounter.    Return in about 4 weeks (around 09/02/2021).     Jerrol Banana, MD   Primary Care Sports Medicine [facility]

## 2021-08-05 NOTE — Assessment & Plan Note (Signed)
Patient has tolerated initial Wegovy 0.5 mg weekly dose, has since titrated to 1 mg which she dosed for the first time this past week.  He has noted early satiety but otherwise minimal to no nausea or abdominal pain.  I did encourage utilization of Wegovy as an adjunct to diet and exercise.  I did provide information for the [facility] coach for additional input.  New Rx for 1.7 mg dose sent to pharmacy, he will start this after 4 weeks total of 1 mg weekly dosing.

## 2021-08-05 NOTE — Patient Instructions (Signed)
Weg[employer] Coach information:   Get Support From Your Coach   If you have a [employer] Coach, you can connect with your Coach about similar topics you might cover with a behavioral expert. Your Coach can also help you set goals, like a nutritionist or physical activity specialist. However, keep in mind that your Coach cannot offer medical advice. You can set up a time to connect with your coach, or if you need immediate assistance, call [phone].   If you haven't yet activated coaching, you can start at any time. Just visit your Settings page.   [email or web address]   - Return in 4 weeks 

## 2021-08-08 ENCOUNTER — Ambulatory Visit: Payer: BC Managed Care – PPO | Admitting: Family Medicine

## 2021-09-07 ENCOUNTER — Other Ambulatory Visit: Payer: Self-pay | Admitting: Family Medicine

## 2021-09-07 ENCOUNTER — Ambulatory Visit (INDEPENDENT_AMBULATORY_CARE_PROVIDER_SITE_OTHER): Payer: BC Managed Care – PPO | Admitting: Family Medicine

## 2021-09-07 ENCOUNTER — Encounter: Payer: Self-pay | Admitting: Family Medicine

## 2021-09-07 VITALS — BP 124/82 | HR 78 | Ht 72.0 in | Wt 305.0 lb

## 2021-09-07 DIAGNOSIS — Z6837 Body mass index (BMI) 37.0-37.9, adult: Secondary | ICD-10-CM | POA: Diagnosis not present

## 2021-09-07 DIAGNOSIS — Z6841 Body Mass Index (BMI) 40.0 and over, adult: Secondary | ICD-10-CM

## 2021-09-07 MED ORDER — SEMAGLUTIDE-WEIGHT MANAGEMENT 2.4 MG/0.75ML ~~LOC~~ SOAJ: 2.4000 mg | SUBCUTANEOUS | 6 refills | Status: DC

## 2021-09-07 NOTE — Progress Notes (Unsigned)
     Primary Care / Sports Medicine Office Visit  Patient Information:  Patient ID: Howard Bauer, male DOB: 1985-07-06 Age: 36 y.o. MRN: [id number]   Howard Bauer is a pleasant 36 y.o. male presenting with the following:  No chief complaint on file.   There were no vitals filed for this visit. There were no vitals filed for this visit. There is no height or weight on file to calculate BMI.  No results found.   Independent interpretation of notes and tests performed by another provider:   None  Procedures performed:   None  Pertinent History, Exam, Impression, and Recommendations:   Problem List Items Addressed This Visit       Other   Class 3 severe obesity due to excess calories without serious comorbidity with body mass index (BMI) of 45.0 to 49.9 in adult [facility]) - Primary    Patient continues to note excellent sustained weight loss, tolerating medication well with just mild nausea day of injection noted, some expected early satiety. I did discuss possibility of asymmetric fat to muscle loss ratio and to perform resistance training regularly - he does this weekly. Cardiopulmonary findings benign, abdomen soft, nontender, nondistended, normoactive bowel sounds. Plan to further titrate Wegovy to 2.4 mg weekly and we will see him back for his physical in 01/2022 timeframe.      Relevant Medications   Semaglutide-Weight Management 2.4 MG/0.75ML SOAJ     Orders & Medications Meds ordered this encounter  Medications   Semaglutide-Weight Management 2.4 MG/0.75ML SOAJ    Sig: Inject 2.4 mg into the skin once a week.    Dispense:  3 mL    Refill:  6   No orders of the defined types were placed in this encounter.    No follow-ups on file.     Jerrol Banana, MD   Primary Care Sports Medicine [facility]

## 2021-09-07 NOTE — Progress Notes (Signed)
     Primary Care / Sports Medicine Office Visit  Patient Information:  Patient ID: Howard Bauer, male DOB: 27-Apr-1985 Age: 36 y.o. MRN: [id number]   Howard Bauer is a pleasant 36 y.o. male presenting with the following:  Chief Complaint  Patient presents with   Class 3 severe obesity due to excess calories without serio    Pt is tolerating medication well.     Vitals:   09/07/21 0753  BP: 124/82  Pulse: 78  SpO2: 98%   Vitals:   09/07/21 0753  Weight: (!) 305 lb (138.3 kg)  Height: 6' (1.829 m)   Body mass index is 41.37 kg/m.  No results found.   Independent interpretation of notes and tests performed by another provider:   None  Procedures performed:   None  Pertinent History, Exam, Impression, and Recommendations:   Problem List Items Addressed This Visit       Other   Body mass index (BMI) of 37.0 to 37.9 in adult   Class 3 severe obesity due to excess calories without serious comorbidity with body mass index (BMI) of 45.0 to 49.9 in adult [facility]) - Primary    Patient has demonstrated excellent weight loss in the setting of lifestyle modifications and Wegovy usage.  He has been tolerating this medication without significant adverse effects reporting nausea only on the day of medication administration and anticipated early satiety.  I discussed possible concerns over loss of fat to muscle ratio and to incorporate regular strength training which he has been doing multiple times a week.  At this stage plan for further titration of 2.4 mg weekly and he can follow-up at annual physical for further evaluation.        Orders & Medications No orders of the defined types were placed in this encounter.  No orders of the defined types were placed in this encounter.    Return in about 5 months (around 02/04/2022) for physical.     Jerrol Banana, MD   Primary Care Sports Medicine [facility]

## 2021-09-07 NOTE — Assessment & Plan Note (Signed)
Patient has demonstrated excellent weight loss in the setting of lifestyle modifications and Wegovy usage.  He has been tolerating this medication without significant adverse effects reporting nausea only on the day of medication administration and anticipated early satiety.  I discussed possible concerns over loss of fat to muscle ratio and to incorporate regular strength training which he has been doing multiple times a week.  At this stage plan for further titration of 2.4 mg weekly and he can follow-up at annual physical for further evaluation.

## 2021-09-07 NOTE — Patient Instructions (Addendum)
-   Increase to Wegovy 2.4 mg - Continue regular strength training and healthy lifestyle changes - Return for physical

## 2021-09-07 NOTE — Assessment & Plan Note (Signed)
Patient continues to note excellent sustained weight loss, tolerating medication well with just mild nausea day of injection noted, some expected early satiety. I did discuss possibility of asymmetric fat to muscle loss ratio and to perform resistance training regularly - he does this weekly. Cardiopulmonary findings benign, abdomen soft, nontender, nondistended, normoactive bowel sounds. Plan to further titrate Wegovy to 2.4 mg weekly and we will see him back for his physical in 01/2022 timeframe.

## 2021-10-20 ENCOUNTER — Telehealth: Payer: Self-pay

## 2021-10-20 NOTE — Telephone Encounter (Signed)
PA completed waiting on insurance approval.  Key: [id number]  [id number]

## 2021-10-25 NOTE — Telephone Encounter (Signed)
Approved from 10/20/2021 through 10/19/2022.  KP

## 2022-01-16 HISTORY — PX: WISDOM TOOTH EXTRACTION: SHX21

## 2022-01-24 ENCOUNTER — Other Ambulatory Visit: Payer: Self-pay | Admitting: Family Medicine

## 2022-01-24 NOTE — Telephone Encounter (Signed)
Dc 'd 09/06/21 by Rudene Christians MD (dose was changed)  Requested Prescriptions  Refused Prescriptions Disp Refills   WEGOVY 1.7 MG/0.75ML SOAJ [Pharmacy Med Name: WEGOVY 1.7 MG/0.75 ML PEN]      Sig: INJECT 1.7 MG INTO THE SKIN ONCE AWEEK USE THIS DOSE X 1 MONTH AND THEN INCREASE TO NEXT HIGHER DOSE     Endocrinology:  Diabetes - GLP-1 Receptor Agonists - semaglutide Failed - 01/24/2022  6:15 AM      Failed - HBA1C in normal range and within 180 days    Hgb A1c MFr Bld  Date Value Ref Range Status  12/21/2017 5.6 <5.7 % of total Hgb Final    Comment:    For the purpose of screening for the presence of diabetes: . <5.7%       Consistent with the absence of diabetes 5.7-6.4%    Consistent with increased risk for diabetes             (prediabetes) > or =6.5%  Consistent with diabetes . This assay result is consistent with a decreased risk of diabetes. . Currently, no consensus exists regarding use of hemoglobin A1c for diagnosis of diabetes in children. . According to American Diabetes Association (ADA) guidelines, hemoglobin A1c <7.0% represents optimal control in non-pregnant diabetic patients. Different metrics may apply to specific patient populations.  Standards of Medical Care in Diabetes(ADA). .          Passed - Cr in normal range and within 360 days    Creat  Date Value Ref Range Status  12/21/2017 1.04 0.60 - 1.35 mg/dL Final   Creatinine, Ser  Date Value Ref Range Status  02/04/2021 0.93 0.76 - 1.27 mg/dL Final         Passed - Valid encounter within last 6 months    Recent Outpatient Visits           4 months ago Class 3 severe obesity due to excess calories without serious comorbidity with body mass index (BMI) of 45.0 to 49.9 in adult [facility])   [place] [facility] at [facility], Ocie Bob, MD   5 months ago Class 3 severe obesity due to excess calories without serious comorbidity with body mass index (BMI) of 45.0 to 49.9  in adult [facility])   [place] [facility] Medicine at [facility], Ocie Bob, MD   7 months ago Class 3 severe obesity due to excess calories without serious comorbidity with body mass index (BMI) of 45.0 to 49.9 in adult [facility])   [place] [facility] Care and Sports Medicine at [facility], Ocie Bob, MD   9 months ago Tobacco dependence   [place] [facility] Care and Sports Medicine at [facility], Ocie Bob, MD   11 months ago Tobacco dependence   [place] [facility] Care and Sports Medicine at [facility], Ocie Bob, MD       Future Appointments             In 2 weeks Ashley Royalty, Ocie Bob, MD [facility] Primary Care and Sports Medicine at [facility], [facility]

## 2022-02-07 ENCOUNTER — Ambulatory Visit (INDEPENDENT_AMBULATORY_CARE_PROVIDER_SITE_OTHER): Payer: BC Managed Care – PPO | Admitting: Family Medicine

## 2022-02-07 ENCOUNTER — Encounter: Payer: Self-pay | Admitting: Family Medicine

## 2022-02-07 VITALS — BP 118/80 | HR 83 | Ht 72.0 in | Wt 297.0 lb

## 2022-02-07 DIAGNOSIS — Z6841 Body Mass Index (BMI) 40.0 and over, adult: Secondary | ICD-10-CM

## 2022-02-07 DIAGNOSIS — Z0000 Encounter for general adult medical examination without abnormal findings: Secondary | ICD-10-CM

## 2022-02-07 DIAGNOSIS — Z7689 Persons encountering health services in other specified circumstances: Secondary | ICD-10-CM

## 2022-02-07 DIAGNOSIS — Z23 Encounter for immunization: Secondary | ICD-10-CM

## 2022-02-07 DIAGNOSIS — E7800 Pure hypercholesterolemia, unspecified: Secondary | ICD-10-CM | POA: Diagnosis not present

## 2022-02-07 DIAGNOSIS — F17200 Nicotine dependence, unspecified, uncomplicated: Secondary | ICD-10-CM

## 2022-02-07 MED ORDER — SEMAGLUTIDE-WEIGHT MANAGEMENT 2.4 MG/0.75ML ~~LOC~~ SOAJ: 2.4000 mg | SUBCUTANEOUS | 6 refills | Status: DC

## 2022-02-07 NOTE — Assessment & Plan Note (Signed)
Has maintained abstinence from cigarettes.

## 2022-02-07 NOTE — Assessment & Plan Note (Signed)
Annual examination completed, risk stratification labs ordered, anticipatory guidance provided.  We will follow labs once resulted. 

## 2022-02-07 NOTE — Patient Instructions (Signed)
-   Obtain fasting labs with orders provided (can have water or black coffee but otherwise no food or drink x 8 hours before labs) °- Review information provided °- Attend eye doctor annually, dentist every 6 months, work towards or maintain 30 minutes of moderate intensity physical activity at least 5 days per week, and consume a balanced diet °- Return in 1 year for physical °- Contact us for any questions between now and then °

## 2022-02-07 NOTE — Progress Notes (Signed)
Annual Physical Exam Visit  Patient Information:  Patient ID: Howard Bauer, male DOB: [birth year] Age: 36 y.o. MRN: [id number]   Subjective:   CC: Annual Physical Exam  HPI:  Howard Bauer is here for their annual physical.  I reviewed the past medical history, family history, social history, surgical history, and allergies today and changes were made as necessary.  Please see the problem list section below for additional details.  Past Medical History: Past Medical History:  Diagnosis Date   GERD (gastroesophageal reflux disease)    in past   Obesity    Peritonsillar abscess 11 20 16    Past Surgical History: Past Surgical History:  Procedure Laterality Date   WISDOM TOOTH EXTRACTION Bilateral 01/16/2022   Family History: Family History  Problem Relation Age of Onset   Heart disease Mother    Cancer Mother        ovarian cancer   Heart disease Father    Stroke Father    Lung disease Father    Diabetes Sister        type 1   Cancer Maternal Grandmother        liver cancer   Allergies: No Known Allergies Health Maintenance: Health Maintenance  Topic Date Due   DTaP/Tdap/Td (3 - Td or Tdap) 02/08/2025   INFLUENZA VACCINE  Completed   Hepatitis C Screening  Completed   HIV Screening  Completed   HPV VACCINES  Aged Out   COVID-19 Vaccine  Discontinued    HM Colonoscopy     This patient has no relevant Health Maintenance data.      Medications: No current outpatient medications on file prior to visit.   No current facility-administered medications on file prior to visit.    Review of Systems: No headache, visual changes, nausea, vomiting, diarrhea, constipation, dizziness, abdominal pain, skin rash, fevers, chills, night sweats, swollen lymph nodes, weight loss, chest pain, body aches, joint swelling, muscle aches, shortness of breath, mood changes, visual or auditory hallucinations reported.  Objective:   Vitals:   02/07/22  0755  BP: 118/80  Pulse: 83  SpO2: 97%   Vitals:   02/07/22 0755  Weight: 297 lb (134.7 kg)  Height: 6' (1.829 m)   Body mass index is 40.28 kg/m.  General: Well Developed, well nourished, and in no acute distress.  Neuro: Alert and oriented x3, extra-ocular muscles intact, sensation grossly intact. Cranial nerves II through XII are grossly intact, motor, sensory, and coordinative functions are intact. HEENT: Normocephalic, atraumatic, pupils equal round reactive to light, neck supple, no masses, no lymphadenopathy, thyroid nonpalpable. Oropharynx, nasopharynx, external ear canals are unremarkable. Skin: Warm and dry, no rashes noted.  Cardiac: Regular rate and rhythm, no murmurs rubs or gallops. No peripheral edema. Pulses symmetric. Respiratory: Clear to auscultation bilaterally. Not using accessory muscles, speaking in full sentences.  Abdominal: Soft, nontender, nondistended, positive bowel sounds, no masses, no organomegaly. Musculoskeletal: Shoulder, elbow, wrist, hip, knee, ankle stable, and with full range of motion.   Impression and Recommendations:   The patient was counselled, risk factors were discussed, and anticipatory guidance given.  Problem List Items Addressed This Visit       Other   RESOLVED: Class 3 severe obesity due to excess calories without serious comorbidity with body mass index (BMI) of 45.0 to 49.9 in adult [facility])   Relevant Medications   Semaglutide-Weight Management 2.4 MG/0.75ML SOAJ   Encounter for weight management    Steady weight loss with  Wegovy 2.4 mg weekly dose, no significant adverse effects reported.  Did discuss long-term goals, ideally maintaining weight without the need for pharmacotherapy.  Over the interim we will send additional [facility] and schedule I year follow-up.      Annual physical exam - Primary    Annual examination completed, risk stratification labs ordered, anticipatory guidance provided.  We will follow labs once  resulted.      Relevant Orders   CBC   Comprehensive metabolic panel   Lipid panel   TSH   Tobacco dependence    Has maintained abstinence from cigarettes.      Other Visit Diagnoses     Need for immunization against influenza       Relevant Orders   Flu Vaccine QUAD 18mo+IM (Fluarix, Fluzone & Alfiuria Quad PF) (Completed)        Orders & Medications Medications:  Meds ordered this encounter  Medications   Semaglutide-Weight Management 2.4 MG/0.75ML SOAJ    Sig: Inject 2.4 mg into the skin once a week.    Dispense:  3 mL    Refill:  6   Orders Placed This Encounter  Procedures   Flu Vaccine QUAD 73mo+IM (Fluarix, Fluzone & Alfiuria Quad PF)   CBC   Comprehensive metabolic panel   Lipid panel   TSH     Return in about 1 year (around 02/08/2023) for CPE.    Jerrol Banana, MD, [facility]   Primary Care Sports Medicine Primary Care and Sports Medicine at [facility]

## 2022-02-07 NOTE — Assessment & Plan Note (Signed)
Steady weight loss with Wegovy 2.4 mg weekly dose, no significant adverse effects reported.  Did discuss long-term goals, ideally maintaining weight without the need for pharmacotherapy.  Over the interim we will send additional [facility] and schedule I year follow-up.

## 2022-02-08 LAB — CBC
Hematocrit: 46.1 % (ref 37.5–51.0)
Hemoglobin: 14.7 g/dL (ref 13.0–17.7)
MCH: 26.9 pg (ref 26.6–33.0)
MCHC: 31.9 g/dL (ref 31.5–35.7)
MCV: 84 fL (ref 79–97)
Platelets: 233 10*3/uL (ref 150–450)
RBC: 5.47 x10E6/uL (ref 4.14–5.80)
RDW: 13.6 % (ref 11.6–15.4)
WBC: 7.4 10*3/uL (ref 3.4–10.8)

## 2022-02-08 LAB — LIPID PANEL
Chol/HDL Ratio: 4.6 ratio (ref 0.0–5.0)
Cholesterol, Total: 175 mg/dL (ref 100–199)
HDL: 38 mg/dL — ABNORMAL LOW (ref >39)
LDL Chol Calc (NIH): 107 mg/dL — ABNORMAL HIGH (ref 0–99)
Triglycerides: 173 mg/dL — ABNORMAL HIGH (ref 0–149)
VLDL Cholesterol Cal: 30 mg/dL (ref 5–40)

## 2022-02-08 LAB — COMPREHENSIVE METABOLIC PANEL
ALT: 20 IU/L (ref 0–44)
AST: 13 IU/L (ref 0–40)
Albumin/Globulin Ratio: 1.6 (ref 1.2–2.2)
Albumin: 4.4 g/dL (ref 4.1–5.1)
Alkaline Phosphatase: 85 IU/L (ref 44–121)
BUN/Creatinine Ratio: 13 (ref 9–20)
BUN: 12 mg/dL (ref 6–20)
Bilirubin Total: 0.4 mg/dL (ref 0.0–1.2)
CO2: 23 mmol/L (ref 20–29)
Calcium: 9.2 mg/dL (ref 8.7–10.2)
Chloride: 104 mmol/L (ref 96–106)
Creatinine, Ser: 0.96 mg/dL (ref 0.76–1.27)
Globulin, Total: 2.8 g/dL (ref 1.5–4.5)
Glucose: 86 mg/dL (ref 70–99)
Potassium: 4.5 mmol/L (ref 3.5–5.2)
Sodium: 142 mmol/L (ref 134–144)
Total Protein: 7.2 g/dL (ref 6.0–8.5)
eGFR: 104 mL/min/{1.73_m2} (ref >59)

## 2022-02-08 LAB — TSH: TSH: 1.67 u[IU]/mL (ref 0.450–4.500)

## 2022-08-26 ENCOUNTER — Other Ambulatory Visit: Payer: Self-pay | Admitting: Family Medicine

## 2022-08-28 NOTE — Telephone Encounter (Signed)
Refill

## 2022-08-28 NOTE — Telephone Encounter (Signed)
Requested medications are due for refill today.  yes  Requested medications are on the active medications list.  yes  Last refill. 02/07/2022 3mL 6 rf  Future visit scheduled.   No - provider states in OV of 01/2022 that pt only needs yearly  Notes to clinic.  Expired labs.    Requested Prescriptions  Pending Prescriptions Disp Refills   WEGOVY 2.4 MG/0.75ML SOAJ [Pharmacy Med Name: WEGOVY 2.4 MG/0.75 ML PEN]  6    Sig: INJECT 2.4 MG INTO THE SKIN ONCE A WEEK.     Endocrinology:  Diabetes - GLP-1 Receptor Agonists - semaglutide Failed - 08/26/2022 11:51 AM      Failed - HBA1C in normal range and within 180 days    Hgb A1c MFr Bld  Date Value Ref Range Status  12/21/2017 5.6 <5.7 % of total Hgb Final    Comment:    For the purpose of screening for the presence of diabetes: . <5.7%       Consistent with the absence of diabetes 5.7-6.4%    Consistent with increased risk for diabetes             (prediabetes) > or =6.5%  Consistent with diabetes . This assay result is consistent with a decreased risk of diabetes. . Currently, no consensus exists regarding use of hemoglobin A1c for diagnosis of diabetes in children. . According to American Diabetes Association (ADA) guidelines, hemoglobin A1c <7.0% represents optimal control in non-pregnant diabetic patients. Different metrics may apply to specific patient populations.  Standards of Medical Care in Diabetes(ADA). .          Failed - Valid encounter within last 6 months    Recent Outpatient Visits           6 months ago Annual physical exam   [place] [facility] Medicine at [facility], Ocie Bob, MD   11 months ago Class 3 severe obesity due to excess calories without serious comorbidity with body mass index (BMI) of 45.0 to 49.9 in adult [facility])   [place] [facility] Care & Sports Medicine at [facility], Ocie Bob, MD   1 year ago Class 3 severe obesity due to excess calories  without serious comorbidity with body mass index (BMI) of 45.0 to 49.9 in adult [facility])   [place] Primary Care & Sports Medicine at [facility], Ocie Bob, MD   1 year ago Class 3 severe obesity due to excess calories without serious comorbidity with body mass index (BMI) of 45.0 to 49.9 in adult [facility])   [place] [facility] & [facility] Medicine at [facility], Ocie Bob, MD   1 year ago Tobacco dependence   [place] [facility] Care & Sports Medicine at [facility], Ocie Bob, MD       Future Appointments             In 5 months Ashley Royalty, Ocie Bob, MD [facility] Care & [facility] Medicine at [facility], [facility]            Passed - Cr in normal range and within 360 days    Creat  Date Value Ref Range Status  12/21/2017 1.04 0.60 - 1.35 mg/dL Final   Creatinine, Ser  Date Value Ref Range Status  02/07/2022 0.96 0.76 - 1.27 mg/dL Final

## 2022-12-14 ENCOUNTER — Encounter: Payer: Self-pay | Admitting: Family Medicine

## 2022-12-18 ENCOUNTER — Telehealth: Payer: Self-pay

## 2022-12-18 DIAGNOSIS — Z6841 Body Mass Index (BMI) 40.0 and over, adult: Secondary | ICD-10-CM | POA: Insufficient documentation

## 2022-12-18 NOTE — Telephone Encounter (Signed)
Completed PA on [email or web address] for Wegovy 2.4 mg.  (Key: [id number]) Rx #: [id number] BJYNWG 2.4MG /0.75ML auto-injectors Form [employer] [place] Commercial Electronic Request Form  Awaiting outcome. Patient informed.

## 2022-12-19 NOTE — Telephone Encounter (Signed)
PA approved until 12/18/2023.  - Vernal Rutan

## 2023-02-12 ENCOUNTER — Ambulatory Visit (INDEPENDENT_AMBULATORY_CARE_PROVIDER_SITE_OTHER): Payer: BC Managed Care – PPO | Admitting: Family Medicine

## 2023-02-12 ENCOUNTER — Encounter: Payer: Self-pay | Admitting: Family Medicine

## 2023-02-12 VITALS — BP 108/68 | HR 85 | Ht 72.0 in | Wt 325.0 lb

## 2023-02-12 DIAGNOSIS — Z131 Encounter for screening for diabetes mellitus: Secondary | ICD-10-CM

## 2023-02-12 DIAGNOSIS — Z7689 Persons encountering health services in other specified circumstances: Secondary | ICD-10-CM

## 2023-02-12 DIAGNOSIS — H1032 Unspecified acute conjunctivitis, left eye: Secondary | ICD-10-CM | POA: Insufficient documentation

## 2023-02-12 DIAGNOSIS — Z23 Encounter for immunization: Secondary | ICD-10-CM

## 2023-02-12 DIAGNOSIS — Z0000 Encounter for general adult medical examination without abnormal findings: Secondary | ICD-10-CM

## 2023-02-12 DIAGNOSIS — E7849 Other hyperlipidemia: Secondary | ICD-10-CM | POA: Insufficient documentation

## 2023-02-12 NOTE — Assessment & Plan Note (Signed)
Hyperlipidemia Discussed the additional benefit of Metamucil in lowering cholesterol. -Continue Metamucil for bowel regulation and cholesterol management.

## 2023-02-12 NOTE — Progress Notes (Signed)
Annual Physical Exam Visit  Patient Information:  Patient ID: Howard Bauer, male DOB: May 14, 1985 Age: 37 y.o. MRN: [id number]   Subjective:   CC: Annual Physical Exam  HPI:  Howard Bauer is here for their annual physical.  I reviewed the past medical history, family history, social history, surgical history, and allergies today and changes were made as necessary.  Please see the problem list section below for additional details.  Past Medical History: Past Medical History:  Diagnosis Date   GERD (gastroesophageal reflux disease)    in past   Obesity    Peritonsillar abscess 11 20 16    Past Surgical History: Past Surgical History:  Procedure Laterality Date   WISDOM TOOTH EXTRACTION Bilateral 01/16/2022   Family History: Family History  Problem Relation Age of Onset   Heart disease Mother    Cancer Mother        ovarian cancer   Heart disease Father    Stroke Father    Lung disease Father    Diabetes Sister        type 1   Cancer Maternal Grandmother        liver cancer   Allergies: No Known Allergies Health Maintenance: Health Maintenance  Topic Date Due   DTaP/Tdap/Td (3 - Td or Tdap) 02/08/2025   INFLUENZA VACCINE  Completed   Hepatitis C Screening  Completed   HIV Screening  Completed   HPV VACCINES  Aged Out   COVID-19 Vaccine  Discontinued    HM Colonoscopy   This patient has no relevant Health Maintenance data.    Medications: No current outpatient medications on file prior to visit.   No current facility-administered medications on file prior to visit.    Objective:   Vitals:   02/12/23 0805  BP: 108/68  Pulse: 85  SpO2: 95%   Vitals:   02/12/23 0805  Weight: (!) 325 lb (147.4 kg)  Height: 6' (1.829 m)   Body mass index is 44.08 kg/m.  General: Well Developed, well nourished, and in no acute distress.  Neuro: Alert and oriented x3, extra-ocular muscles intact, sensation grossly intact. Cranial nerves II  through XII are grossly intact, motor, sensory, and coordinative functions are intact. HEENT: Normocephalic, atraumatic, neck supple, no masses, no lymphadenopathy, thyroid nonenlarged. Oropharynx, nasopharynx, external ear canals are unremarkable.  Left eye faintly erythematous, no drainage Skin: Warm and dry, no rashes noted.  Cardiac: Regular rate and rhythm, no murmurs rubs or gallops. No peripheral edema. Pulses symmetric. Respiratory: Clear to auscultation bilaterally. Speaking in full sentences.  Abdominal: Soft, nontender, nondistended, positive bowel sounds, no masses, no organomegaly. Musculoskeletal: Stable, and with full range of motion.   Impression and Recommendations:   The patient was counselled, risk factors were discussed, and anticipatory guidance given.  Problem List Items Addressed This Visit       Other   Acute conjunctivitis of left eye   Allergic Conjunctivitis Patient reports eye irritation and scant nonpurulent left eye crusting with intermittent clear drainage throughout the day since exposure in an old house, suggestive of an allergic etiology. -Start over-the-counter antihistamine. -If symptoms worsen or become purulent, patient to message doctor for possible prescription eye drops.      Encounter for weight management   Presents after discontinuing semaglutide due to indigestion and bowel issues. They report that the medication was causing a lot of indigestion and was affecting their bowel movements.  Patient has been inconsistent with semaglutide due to gastrointestinal side  effects, including indigestion and decreased bowel motility. Discussed the mechanism of action of semaglutide and its effects on appetite and gastrointestinal motility. -Discontinue semaglutide temporarily. -Initiate Metamucil and possibly Dulcolax to improve bowel motility. -Once bowel movements are regular, patient to message doctor to restart semaglutide at a lower dose.       Healthcare maintenance - Primary   Annual examination completed, risk stratification labs ordered, anticipatory guidance provided.  We will follow labs once resulted.  General Health Maintenance -Consider stress management strategies if sleep or daily functioning is affected.      Relevant Orders   CBC   Hemoglobin A1c   Comprehensive metabolic panel   Lipid panel   Flu vaccine trivalent PF, 6mos and older(Flulaval,Afluria,Fluarix,Fluzone) (Completed)   Other hyperlipidemia   Hyperlipidemia Discussed the additional benefit of Metamucil in lowering cholesterol. -Continue Metamucil for bowel regulation and cholesterol management.      Relevant Orders   Lipid panel   Other Visit Diagnoses       Screening for diabetes mellitus       Relevant Orders   Hemoglobin A1c        Orders & Medications Medications: No orders of the defined types were placed in this encounter.  Orders Placed This Encounter  Procedures   Flu vaccine trivalent PF, 6mos and older(Flulaval,Afluria,Fluarix,Fluzone)   CBC   Hemoglobin A1c   Comprehensive metabolic panel   Lipid panel     Return in about 1 year (around 02/12/2024) for CPE.    Jerrol Banana, MD, [facility]   Primary Care Sports Medicine Primary Care and Sports Medicine at [facility]

## 2023-02-12 NOTE — Patient Instructions (Addendum)
-   Obtain fasting labs with orders provided (can have water or black coffee but otherwise no food or drink x 8 hours before labs) - Review information provided - Attend eye doctor annually, dentist every 6 months, work towards or maintain 30 minutes of moderate intensity physical activity at least 5 days per week, and consume a balanced diet - Return in 1 year for physical - Contact us for any questions between now and then  Additionally: YOUR PLAN:  - Weight management:   - Temporarily stop semaglutide due to digestive side effects.   - Start Metamucil and possibly Dulcolax for bowel movements (see below for full stepwise regimen).   - Message once bowel movements are regular to restart semaglutide at a lower dose.  - Hyperlipidemia:   - Continue Metamucil to aid bowel movements and lower cholesterol levels.  - Possible Allergic Conjunctivitis:   - Use over-the-counter antihistamine for eye irritation.   - Message if symptoms worsen or pus is present for prescription eye drops.  - General Health Maintenance:   - Consider stress management strategies if sleep or daily activities are affected.  Constipation Stepwise Treatment  Find a consistent over-the-counter regimen that allows for at least 1-2 smooth, formed, bowel movements daily. See steps below and reserve Steps 5 and 6 for times without a bowel movement x 2-3 days despite regular OTC regimen.

## 2023-02-12 NOTE — Assessment & Plan Note (Signed)
Allergic Conjunctivitis Patient reports eye irritation and scant nonpurulent left eye crusting with intermittent clear drainage throughout the day since exposure in an old house, suggestive of an allergic etiology. -Start over-the-counter antihistamine. -If symptoms worsen or become purulent, patient to message doctor for possible prescription eye drops.

## 2023-02-12 NOTE — Assessment & Plan Note (Signed)
Annual examination completed, risk stratification labs ordered, anticipatory guidance provided.  We will follow labs once resulted.  General Health Maintenance -Consider stress management strategies if sleep or daily functioning is affected.

## 2023-02-12 NOTE — Assessment & Plan Note (Addendum)
Presents after discontinuing semaglutide due to indigestion and bowel issues. They report that the medication was causing a lot of indigestion and was affecting their bowel movements.  Patient has been inconsistent with semaglutide due to gastrointestinal side effects, including indigestion and decreased bowel motility. Discussed the mechanism of action of semaglutide and its effects on appetite and gastrointestinal motility. -Discontinue semaglutide temporarily. -Initiate Metamucil and possibly Dulcolax to improve bowel motility. -Once bowel movements are regular, patient to message doctor to restart semaglutide at a lower dose.

## 2023-02-13 LAB — CBC
Hematocrit: 45.8 % (ref 37.5–51.0)
Hemoglobin: 15.2 g/dL (ref 13.0–17.7)
MCH: 27.9 pg (ref 26.6–33.0)
MCHC: 33.2 g/dL (ref 31.5–35.7)
MCV: 84 fL (ref 79–97)
Platelets: 238 10*3/uL (ref 150–450)
RBC: 5.44 x10E6/uL (ref 4.14–5.80)
RDW: 13.4 % (ref 11.6–15.4)
WBC: 8.2 10*3/uL (ref 3.4–10.8)

## 2023-02-13 LAB — LIPID PANEL
Chol/HDL Ratio: 4.7 {ratio} (ref 0.0–5.0)
Cholesterol, Total: 161 mg/dL (ref 100–199)
HDL: 34 mg/dL — ABNORMAL LOW (ref >39)
LDL Chol Calc (NIH): 99 mg/dL (ref 0–99)
Triglycerides: 157 mg/dL — ABNORMAL HIGH (ref 0–149)
VLDL Cholesterol Cal: 28 mg/dL (ref 5–40)

## 2023-02-13 LAB — COMPREHENSIVE METABOLIC PANEL
ALT: 14 [IU]/L (ref 0–44)
AST: 15 [IU]/L (ref 0–40)
Albumin: 4.1 g/dL (ref 4.1–5.1)
Alkaline Phosphatase: 89 [IU]/L (ref 44–121)
BUN/Creatinine Ratio: 15 (ref 9–20)
BUN: 13 mg/dL (ref 6–20)
Bilirubin Total: 0.3 mg/dL (ref 0.0–1.2)
CO2: 23 mmol/L (ref 20–29)
Calcium: 9.2 mg/dL (ref 8.7–10.2)
Chloride: 104 mmol/L (ref 96–106)
Creatinine, Ser: 0.89 mg/dL (ref 0.76–1.27)
Globulin, Total: 3 g/dL (ref 1.5–4.5)
Glucose: 80 mg/dL (ref 70–99)
Potassium: 4.5 mmol/L (ref 3.5–5.2)
Sodium: 140 mmol/L (ref 134–144)
Total Protein: 7.1 g/dL (ref 6.0–8.5)
eGFR: 112 mL/min/{1.73_m2} (ref >59)

## 2023-02-13 LAB — HEMOGLOBIN A1C
Est. average glucose Bld gHb Est-mCnc: 120 mg/dL
Hgb A1c MFr Bld: 5.8 % — ABNORMAL HIGH (ref 4.8–5.6)

## 2023-03-31 ENCOUNTER — Encounter: Payer: Self-pay | Admitting: Family Medicine

## 2023-04-02 NOTE — Telephone Encounter (Signed)
 Please review.  KP

## 2023-04-03 ENCOUNTER — Other Ambulatory Visit: Payer: Self-pay

## 2023-04-03 MED ORDER — WEGOVY 0.25 MG/0.5ML ~~LOC~~ SOAJ: 0.2500 mg | SUBCUTANEOUS | 2 refills | Status: DC

## 2023-04-03 NOTE — Telephone Encounter (Signed)
error 

## 2023-04-03 NOTE — Telephone Encounter (Signed)
Please call pt to schedule an appt for 3 months.  KP

## 2023-04-26 ENCOUNTER — Telehealth: Admitting: Physician Assistant

## 2023-04-26 ENCOUNTER — Ambulatory Visit (INDEPENDENT_AMBULATORY_CARE_PROVIDER_SITE_OTHER): Admitting: Family Medicine

## 2023-04-26 VITALS — BP 136/92 | HR 96 | Ht 72.0 in | Wt 327.0 lb

## 2023-04-26 DIAGNOSIS — R300 Dysuria: Secondary | ICD-10-CM

## 2023-04-26 DIAGNOSIS — R310 Gross hematuria: Secondary | ICD-10-CM | POA: Diagnosis not present

## 2023-04-26 DIAGNOSIS — Z6841 Body Mass Index (BMI) 40.0 and over, adult: Secondary | ICD-10-CM | POA: Diagnosis not present

## 2023-04-26 MED ORDER — CIPROFLOXACIN HCL 500 MG PO TABS: 500.0000 mg | ORAL_TABLET | Freq: Two times a day (BID) | ORAL | 0 refills | Status: AC

## 2023-04-26 NOTE — Assessment & Plan Note (Addendum)
 History of Present Illness Howard Bauer is a 38 year old male who presents with painful urinary symptoms x 2-3 days.  He has been experiencing urinary symptoms since Tuesday, initially feeling lightheaded as if a cold was coming on. He began taking Dayquil, which provided little relief. By the following night, he experienced severe chills and frequent urination, occurring hourly overnight. He describes an inability to suppress the urge to urinate and took Uristat for relief, which provided some benefit.  He experiences pain during urination localized to the bladder area. He notes some back pain during the night of the chills, which has been less noticeable since. No new exposures including new sexual partners.  He feels drained and notes that the symptoms have persisted. No new exposures or changes in routine practices. Bowel movements have remained stable, occurring daily with occasional mild straining, but no significant changes in frequency or effort. He mentions consuming a lot of water this week and reports no nausea, but does feel lightheaded.  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  Physical Exam VITALS: BP- 96/ CARDIOVASCULAR: Heart normal ABDOMEN: Abdomen non-tender. Bowel sounds decreased. No costovertebral angle tenderness.  Results LABS Urinalysis: Hematuria (04/26/2023)  Assessment and Plan UTI v Prostatitis Symptoms coupled with noted hematuria raises differential including prostatitis and UTI. Discussed empiric treatment with antibiotics and urgent care need if symptoms worsen. - Send urine for culture. - Prescribe antibiotics covering prostatitis and UTI. - Order blood work for infection and STI markers. - Check PSA levels. - Advise hydration. - Recommend probiotics during antibiotic treatment. - Advise anti-gas medications if needed. - Provide  bowel regimen cheat sheet via separate MyChart message. - Advise rest and consider taking days off work. - Instruct to seek urgent care if symptoms worsen.

## 2023-04-26 NOTE — Progress Notes (Signed)
 E-Visit for Urinary Problems ? ?Based on what you shared with me, I feel your condition warrants further evaluation and I recommend that you be seen for a face to face office visit.  Male bladder infections are not very common.  We worry about prostate or kidney conditions.  The standard of care is to examine the abdomen and kidneys, and to do a urine and blood test to make sure that something more serious is not going on.  We recommend that you see a provider today.  If your doctor's office is closed [place] has the following Urgent Cares: ? ?  ?NOTE: You will not be charged for this e-visit. ? ?If you are having a true medical emergency please call 911.   ? ?  ? For an urgent face to face visit, [place] has six urgent care centers for your convenience:  ?  ? [facility] ?Get Driving Directions ?[phone] ?[phone] ?[place], [place] [address] ?  ? [facility]) ?Get Driving Directions ?[phone] ?[address] ?[place], [place] [address] ? ?[facility]) ?Get Driving Directions ?[phone] ?[address] ?[place],  [place]  [address] ? ?[place] ?Get Driving Directions ?[phone] ?[address], [address] ?[place], [place] [address] ?  ?[place] [facility] [place] ?Get Driving Directions  ?[phone] ?[address] ?[address] ?[place], [place] [address] ?  ? Urgent Care at [facility] ?Get Driving Directions ?[phone] ?[address] ?[place], [place] [address] ? ?Your MyChart E-visit questionnaire answers were reviewed by a board certified advanced clinical practitioner to complete your personal care plan based on your specific symptoms.  Thank you for using e-Visits. ?

## 2023-04-27 ENCOUNTER — Encounter: Payer: Self-pay | Admitting: Family Medicine

## 2023-04-27 LAB — CBC
Hematocrit: 43.4 % (ref 37.5–51.0)
Hemoglobin: 14.4 g/dL (ref 13.0–17.7)
MCH: 27.7 pg (ref 26.6–33.0)
MCHC: 33.2 g/dL (ref 31.5–35.7)
MCV: 84 fL (ref 79–97)
Platelets: 229 10*3/uL (ref 150–450)
RBC: 5.2 x10E6/uL (ref 4.14–5.80)
RDW: 13.6 % (ref 11.6–15.4)
WBC: 16 10*3/uL — ABNORMAL HIGH (ref 3.4–10.8)

## 2023-04-27 LAB — PSA: Prostate Specific Ag, Serum: 3.4 ng/mL (ref 0.0–4.0)

## 2023-04-27 NOTE — Assessment & Plan Note (Signed)
 Wegovy use On Wegovy 0.25 mg for weight management. Advised to skip next dose due to current infection. - Skip next scheduled dose of Wegovy. - Monitor bowel movements and adjust regimen as needed.

## 2023-04-27 NOTE — Patient Instructions (Addendum)
 Patient Plan   1. Medications and Tests    - Begin taking ciprofloxacin (CIPRO) 500 mg tablets as prescribed for UTI/prostatitis.    - Send urine sample for culture.    - Complete blood work as ordered.   2. Hydration and Diet    - Drink plenty of water to stay hydrated.    - Take probiotics during antibiotic treatment to support gut health.    - Use anti-gas medications if needed.  3. Bowel Movements    - Follow the bowel regimen provided via MyChart for guidance.  4. Rest and Work    - Rest as much as possible and consider taking days off work if symptoms persist.  5. Red Flags to Monitor    - Seek urgent care if symptoms worsen or do not improve with treatment.  Please follow these steps carefully and reach out to the office with any questions or concerns.

## 2023-04-27 NOTE — Progress Notes (Signed)
 Primary Care / Sports Medicine Office Visit  Patient Information:  Patient ID: Howard Bauer, male DOB: 01-12-1986 Age: 38 y.o. MRN: [id number]   Howard Bauer is a pleasant 38 y.o. male presenting with the following:  Chief Complaint  Patient presents with   Urinary Tract Infection    Patient started to feel bad on Tuesday 04/24/23. Began to have cold chills and having to get up every hour to use the restroom on Wednesday. Having bloody urine and painful frequent urination.     Vitals:   04/26/23 1414  BP: (!) 136/92  Pulse: 96  SpO2: 97%   Vitals:   04/26/23 1414  Weight: (!) 327 lb (148.3 kg)  Height: 6' (1.829 m)   Body mass index is 44.35 kg/m.  No results found.   Independent interpretation of notes and tests performed by another provider:   None  Procedures performed:   None  Pertinent History, Exam, Impression, and Recommendations:   Problem List Items Addressed This Visit     BMI 40.0-44.9, adult (HCC)   Wegovy use On Wegovy 0.25 mg for weight management. Advised to skip next dose due to current infection. - Skip next scheduled dose of Wegovy. - Monitor bowel movements and adjust regimen as needed.      Dysuria - Primary   History of Present Illness Howard Bauer is a 38 year old male who presents with painful urinary symptoms x 2-3 days.  He has been experiencing urinary symptoms since Tuesday, initially feeling lightheaded as if a cold was coming on. He began taking Dayquil, which provided little relief. By the following night, he experienced severe chills and frequent urination, occurring hourly overnight. He describes an inability to suppress the urge to urinate and took Uristat for relief, which provided some benefit.  He experiences pain during urination localized to the bladder area. He notes some back pain during the night of the chills, which has been less noticeable since. No new exposures including new  sexual partners.  He feels drained and notes that the symptoms have persisted. No new exposures or changes in routine practices. Bowel movements have remained stable, occurring daily with occasional mild straining, but no significant changes in frequency or effort. He mentions consuming a lot of water this week and reports no nausea, but does feel lightheaded.  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  Physical Exam VITALS: BP- 96/ CARDIOVASCULAR: Heart normal ABDOMEN: Abdomen non-tender. Bowel sounds decreased. No costovertebral angle tenderness.  Results LABS Urinalysis: Hematuria (04/26/2023)  Assessment and Plan UTI v Prostatitis Symptoms coupled with noted hematuria raises differential including prostatitis and UTI. Discussed empiric treatment with antibiotics and urgent care need if symptoms worsen. - Send urine for culture. - Prescribe antibiotics covering prostatitis and UTI. - Order blood work for infection and STI markers. - Check PSA levels. - Advise hydration. - Recommend probiotics during antibiotic treatment. - Advise anti-gas medications if needed. - Provide bowel regimen cheat sheet via separate MyChart message. - Advise rest and consider taking days off work. - Instruct to seek urgent care if symptoms worsen.      Relevant Medications   ciprofloxacin (CIPRO) 500 MG tablet   Other Relevant Orders   Urine Culture   CBC (Completed)   RPR   HCV RNA quant rflx ultra or genotyp   HIV Antibody (  routine testing w rflx)   PSA (Completed)   GC/Chlamydia Probe Amp([facility])   Other Visit Diagnoses       Gross hematuria       Relevant Medications   ciprofloxacin (CIPRO) 500 MG tablet   Other Relevant Orders   CBC (Completed)   RPR   HCV RNA quant rflx ultra or genotyp   HIV Antibody (routine testing w rflx)   PSA (Completed)   GC/Chlamydia Probe  Amp([facility])        Orders & Medications Medications:  Meds ordered this encounter  Medications   ciprofloxacin (CIPRO) 500 MG tablet    Sig: Take 1 tablet (500 mg total) by mouth 2 (two) times daily for 7 days.    Dispense:  14 tablet    Refill:  0   Orders Placed This Encounter  Procedures   Urine Culture   GC/Chlamydia Probe Amp([facility])   CBC   RPR   HCV RNA quant rflx ultra or genotyp   HIV Antibody (routine testing w rflx)   PSA     No follow-ups on file.     Jerrol Banana, MD, [facility]   Primary Care Sports Medicine Primary Care and Sports Medicine at [facility]

## 2023-04-28 LAB — GC/CHLAMYDIA PROBE AMP
Chlamydia trachomatis, NAA: NEGATIVE
Neisseria Gonorrhoeae by PCR: NEGATIVE

## 2023-04-29 LAB — URINE CULTURE

## 2023-05-10 ENCOUNTER — Other Ambulatory Visit: Payer: Self-pay | Admitting: Family Medicine

## 2023-05-10 DIAGNOSIS — E66813 Obesity, class 3: Secondary | ICD-10-CM

## 2023-05-11 NOTE — Telephone Encounter (Signed)
 Refill not appropriate, Rx discontinued 02/12/23 due to change in dose. Requested Prescriptions  Pending Prescriptions Disp Refills   WEGOVY 2.4 MG/0.75ML SOAJ [Pharmacy Med Name: WEGOVY 2.4 MG/0.75 ML PEN]  6    Sig: INJECT 2.4 MG INTO THE SKIN ONCE A WEEK.     Endocrinology:  Diabetes - GLP-1 Receptor Agonists - semaglutide Failed - 05/11/2023  8:51 AM      Failed - HBA1C in normal range and within 180 days    Hgb A1c MFr Bld  Date Value Ref Range Status  02/12/2023 5.8 (H) 4.8 - 5.6 % Final    Comment:             Prediabetes: 5.7 - 6.4          Diabetes: >6.4          Glycemic control for adults with diabetes: <7.0          Passed - Cr in normal range and within 360 days    Creat  Date Value Ref Range Status  12/21/2017 1.04 0.60 - 1.35 mg/dL Final   Creatinine, Ser  Date Value Ref Range Status  02/12/2023 0.89 0.76 - 1.27 mg/dL Final         Passed - Valid encounter within last 6 months    Recent Outpatient Visits           2 months ago Healthcare maintenance   [facility] Primary Care & Sports Medicine at [facility], Ocie Bob, MD   1 year ago Annual physical exam   [place] Primary Care & Sports Medicine at [facility], Ocie Bob, MD   1 year ago Class 3 severe obesity due to excess calories without serious comorbidity with body mass index (BMI) of 45.0 to 49.9 in adult [facility])   [place] Primary Care & Sports Medicine at [facility], Ocie Bob, MD   1 year ago Class 3 severe obesity due to excess calories without serious comorbidity with body mass index (BMI) of 45.0 to 49.9 in adult [facility])   [place] Primary Care & Sports Medicine at [facility], Ocie Bob, MD   1 year ago Class 3 severe obesity due to excess calories without serious comorbidity with body mass index (BMI) of 45.0 to 49.9 in adult [facility])   [facility] Primary Care & Sports Medicine at [facility], Ocie Bob, MD       Future  Appointments             In 9 months Ashley Royalty, Ocie Bob, MD [facility] Primary Care & Sports Medicine at [facility], [facility]

## 2023-06-20 ENCOUNTER — Other Ambulatory Visit: Payer: Self-pay | Admitting: Family Medicine

## 2023-06-22 NOTE — Telephone Encounter (Signed)
 Requested medication (s) are due for refill today: yes  Requested medication (s) are on the active medication list: yes  Last refill:  04/03/23 2 ml 2 RF  Future visit scheduled: yes  Notes to clinic:  needs next dose    Requested Prescriptions  Pending Prescriptions Disp Refills   WEGOVY  0.25 MG/0.5ML SOAJ [Pharmacy Med Name: WEGOVY  0.25 MG/0.5 ML PEN]  2    Sig: INJECT 0.25 MG INTO SKIN ONCE A WEEK. USE THIS DOSE FOR 1 MONTH AND THEN INCREASE TO NEXT DOSE     Endocrinology:  Diabetes - GLP-1 Receptor Agonists - semaglutide  Failed - 06/22/2023  2:30 PM      Failed - HBA1C in normal range and within 180 days    Hgb A1c MFr Bld  Date Value Ref Range Status  02/12/2023 5.8 (H) 4.8 - 5.6 % Final    Comment:             Prediabetes: 5.7 - 6.4          Diabetes: >6.4          Glycemic control for adults with diabetes: <7.0          Failed - Valid encounter within last 6 months    Recent Outpatient Visits           1 month ago Dysuria   [facility] Primary Care & Sports Medicine at [facility] [place] [name], [name], MD       Future Appointments             In 8 months [name] [name], MD [facility] Primary Care & Sports Medicine at [facility], [facility]            Passed - Cr in normal range and within 360 days    Creat  Date Value Ref Range Status  12/21/2017 1.04 0.60 - 1.35 mg/dL Final   Creatinine, Ser  Date Value Ref Range Status  02/12/2023 0.89 0.76 - 1.27 mg/dL Final

## 2023-09-20 ENCOUNTER — Other Ambulatory Visit: Payer: Self-pay | Admitting: Family Medicine

## 2023-09-21 NOTE — Telephone Encounter (Signed)
 Requested medication (s) are due for refill today: yes  Requested medication (s) are on the active medication list: no  Last refill:  06/26/23  Future visit scheduled: yes  Notes to clinic:  Unable to refill per protocol, Rx expired. Not on current medication list, possible new prescription is needed.      Requested Prescriptions  Pending Prescriptions Disp Refills   WEGOVY  0.25 MG/0.5ML SOAJ [Pharmacy Med Name: WEGOVY  0.25 MG/0.5 ML PEN]  2    Sig: INJECT 0.25 MG INTO SKIN ONCE A WEEK. USE THIS DOSE FOR 1 MONTH AND THEN INCREASE TO NEXT DOSE     Endocrinology:  Diabetes - GLP-1 Receptor Agonists - semaglutide  Failed - 09/21/2023 10:59 AM      Failed - HBA1C in normal range and within 180 days    Hgb A1c MFr Bld  Date Value Ref Range Status  02/12/2023 5.8 (H) 4.8 - 5.6 % Final    Comment:             Prediabetes: 5.7 - 6.4          Diabetes: >6.4          Glycemic control for adults with diabetes: <7.0          Passed - Cr in normal range and within 360 days    Creat  Date Value Ref Range Status  12/21/2017 1.04 0.60 - 1.35 mg/dL Final   Creatinine, Ser  Date Value Ref Range Status  02/12/2023 0.89 0.76 - 1.27 mg/dL Final         Passed - Valid encounter within last 6 months    Recent Outpatient Visits           4 months ago Dysuria   [facility] Primary Care & Sports Medicine at [facility], Selinda PARAS, MD       Future Appointments             In 5 months Alvia, Selinda PARAS, MD [facility] Primary Care & Sports Medicine at [facility], [facility]

## 2023-12-20 ENCOUNTER — Other Ambulatory Visit (HOSPITAL_COMMUNITY): Payer: Self-pay

## 2023-12-20 ENCOUNTER — Telehealth: Payer: Self-pay

## 2023-12-20 NOTE — Telephone Encounter (Signed)
 Pharmacy Patient Advocate Encounter   Received notification from CoverMyMeds that prior authorization for Wegovy  2.4mg /0.8ml is due for renewal.   Insurance verification completed.   The patient is insured through [facility].   Action: Patient hasn't been seen in your office in over 6 months. Plan requires updated chart notes for PA renewal.   Key: [id number]

## 2023-12-31 ENCOUNTER — Other Ambulatory Visit (HOSPITAL_COMMUNITY): Payer: Self-pay

## 2024-02-18 ENCOUNTER — Encounter: Payer: Self-pay | Admitting: Family Medicine

## 2024-02-18 ENCOUNTER — Ambulatory Visit: Payer: Self-pay | Admitting: Family Medicine

## 2024-02-18 VITALS — BP 98/76 | HR 84 | Ht 72.0 in | Wt 334.0 lb

## 2024-02-18 DIAGNOSIS — Z23 Encounter for immunization: Secondary | ICD-10-CM

## 2024-02-18 DIAGNOSIS — Z7689 Persons encountering health services in other specified circumstances: Secondary | ICD-10-CM

## 2024-02-18 DIAGNOSIS — E7849 Other hyperlipidemia: Secondary | ICD-10-CM

## 2024-02-18 DIAGNOSIS — Z0000 Encounter for general adult medical examination without abnormal findings: Secondary | ICD-10-CM

## 2024-02-18 DIAGNOSIS — E661 Drug-induced obesity: Secondary | ICD-10-CM

## 2024-02-18 DIAGNOSIS — Z6841 Body Mass Index (BMI) 40.0 and over, adult: Secondary | ICD-10-CM | POA: Diagnosis not present

## 2024-02-18 DIAGNOSIS — R7303 Prediabetes: Secondary | ICD-10-CM | POA: Insufficient documentation

## 2024-02-18 DIAGNOSIS — R300 Dysuria: Secondary | ICD-10-CM | POA: Diagnosis not present

## 2024-02-18 DIAGNOSIS — E66813 Obesity, class 3: Secondary | ICD-10-CM | POA: Diagnosis not present

## 2024-02-18 MED ORDER — SEMAGLUTIDE-WEIGHT MANAGEMENT 1 MG/0.5ML ~~LOC~~ SOAJ: 1.0000 mg | SUBCUTANEOUS | 0 refills | Status: AC

## 2024-02-18 MED ORDER — SEMAGLUTIDE-WEIGHT MANAGEMENT 0.5 MG/0.5ML ~~LOC~~ SOAJ: 0.5000 mg | SUBCUTANEOUS | 0 refills | Status: AC

## 2024-02-18 MED ORDER — SEMAGLUTIDE-WEIGHT MANAGEMENT 0.25 MG/0.5ML ~~LOC~~ SOAJ: 0.2500 mg | SUBCUTANEOUS | 0 refills | Status: AC

## 2024-02-18 NOTE — Progress Notes (Addendum)
 "    Annual Physical Exam Visit  Patient Information:  Patient ID: Howard Bauer, male DOB: [birth year] Age: 38 y.o. MRN: [id number]   Subjective:   CC: Annual Physical Exam  HPI:  Howard Bauer is here for their annual physical.  I reviewed the past medical history, family history, social history, surgical history, and allergies today and changes were made as necessary.  Please see the problem list section below for additional details.  Past Medical History: Past Medical History:  Diagnosis Date   GERD (gastroesophageal reflux disease)    in past   Obesity    Peritonsillar abscess 11 20 16    Past Surgical History: Past Surgical History:  Procedure Laterality Date   WISDOM TOOTH EXTRACTION Bilateral 01/16/2022   Family History: Family History  Problem Relation Age of Onset   Heart disease Mother    Cancer Mother        ovarian cancer   Heart disease Father    Stroke Father    Lung disease Father    Diabetes Sister        type 1   Cancer Maternal Grandmother        liver cancer   Allergies: Allergies[1] Health Maintenance: Health Maintenance  Topic Date Due   Hepatitis B Vaccines 19-59 Average Risk (1 of 3 - 19+ 3-dose series) Never done   HPV VACCINES (1 - 3-dose SCDM series) Never done   DTaP/Tdap/Td (3 - Td or Tdap) 02/08/2025   Influenza Vaccine  Completed   Hepatitis C Screening  Completed   HIV Screening  Completed   Pneumococcal Vaccine  Aged Out   Meningococcal B Vaccine  Aged Out   COVID-19 Vaccine  Discontinued    HM Colonoscopy   This patient has no relevant Health Maintenance data.    Medications: Medications Ordered Prior to Encounter[2]  Discussed the use of AI scribe software for clinical note transcription with the patient, who gave verbal consent to proceed.   Objective:   Vitals:   02/18/24 0758  BP: 98/76  Pulse: 84  SpO2: 95%   Vitals:   02/18/24 0758  Weight: (!) 334 lb (151.5 kg)  Height: 6' (1.829 m)    Body mass index is 45.3 kg/m.  General: Well Developed, well nourished, and in no acute distress.  Neuro: Alert and oriented x3, extra-ocular muscles intact, sensation grossly intact. Cranial nerves II through XII are grossly intact, motor, sensory, and coordinative functions are intact. HEENT: Normocephalic, atraumatic, neck supple, no masses, no lymphadenopathy, thyroid  nonenlarged. Oropharynx, nasopharynx, external ear canals are unremarkable. Skin: Warm and dry, no rashes noted.  Cardiac: Regular rate and rhythm, no murmurs rubs or gallops. No peripheral edema. Pulses symmetric. Respiratory: Clear to auscultation bilaterally. Speaking in full sentences.  Abdominal: Soft, nontender, nondistended, positive bowel sounds, no masses, no organomegaly. Musculoskeletal: Stable, and with full range of motion.  Impression and Recommendations:    History of Present Illness Howard Bauer is a 38 year old male with obesity and prediabetes who presents for follow-up of urinary symptoms and weight management.  Urinary symptoms - In March 2025, experienced suprapubic pain, back pain, chills, lightheadedness, and fatigue. - Laboratory evaluation revealed microscopic hematuria. - Symptoms resolved completely following a course of antibiotics. - Since resolution, only one isolated episode of urinary pain. - No recurrence of dysuria, hematuria, or changes in urinary frequency.  Prediabetes - Hemoglobin A1c values of 5.6 and 5.8 in December 2024. - No polyuria, polydipsia, or polyphagia.  Obesity and weight management - Previously used govitecan for weight management until August 2024 at the lowest dose. - No significant weight loss or adverse effects with govitecan.  Results Labs Urinalysis (04/2022): Microscopic hematuria HbA1c (01/2023): 5.8 Lipid panel: Within normal limits  Assessment and Plan Obesity Chronic obesity with previous Wegovy  use. Motivated to resume  pharmacotherapy with insurance coverage. Weight loss expected after 3-4 months. Side effects manageable with behavioral modifications. - Prescribed Wegovy  with monthly dose titration, [facility]. - Scheduled follow-up in three months for weight management assessment. - Provided guidance on weight loss timeline (3-4 months). - Discussed side effect management; offered ondansetron for nausea. - Will send MyChart message with constipation management. - Advised monitoring for side effects and reporting concerns. - Ordered baseline labs before restarting medication.  Prediabetes Prediabetes with hemoglobin A1c of 5.8%. Monitoring glycemic control is crucial to prevent complications. - Ordered labs to monitor glycemic control and assess progression. - Educated on hyperglycemia and urinary tract infection risk. - Advised reporting new urinary symptoms or health changes.  The patient was counselled, risk factors were discussed, and anticipatory guidance given.  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 [facility])   Relevant Medications   semaglutide -weight management (WEGOVY ) 0.25 MG/0.5ML SOAJ SQ injection   semaglutide -weight management (WEGOVY ) 0.5 MG/0.5ML SOAJ SQ injection (Start on 03/18/2024)   semaglutide -weight management (WEGOVY ) 1 MG/0.5ML SOAJ SQ injection (Start on 04/16/2024)   Dysuria   Encounter for weight management   Healthcare maintenance - Primary   Relevant Orders   CBC   Comprehensive metabolic panel with GFR   Hemoglobin A1c   Lipid panel   Need for immunization against influenza   Other hyperlipidemia   Prediabetes   Other Visit Diagnoses       Encounter for immunization       Relevant Orders   Flu vaccine trivalent PF, 6mos and older(Flulaval,Afluria,Fluarix,Fluzone) (Completed)        Orders & Medications Medications:  Meds ordered this  encounter  Medications   semaglutide -weight management (WEGOVY ) 0.25 MG/0.5ML SOAJ SQ injection    Sig: Inject 0.25 mg into the skin once a week for 28 days.    Dispense:  2 mL    Refill:  0   semaglutide -weight management (WEGOVY ) 0.5 MG/0.5ML SOAJ SQ injection    Sig: Inject 0.5 mg into the skin once a week for 28 days.    Dispense:  2 mL    Refill:  0   semaglutide -weight management (WEGOVY ) 1 MG/0.5ML SOAJ SQ injection    Sig: Inject 1 mg into the skin once a week for 28 days.    Dispense:  2 mL    Refill:  0   Orders Placed This Encounter  Procedures   Flu vaccine trivalent PF, 6mos and older(Flulaval,Afluria,Fluarix,Fluzone)   CBC   Comprehensive metabolic panel with GFR   Hemoglobin A1c   Lipid panel     No follow-ups on file.    Selinda JINNY Ku, MD, CAQSM   Primary Care Sports Medicine Primary Care and Sports Medicine at [facility] [place]      [1] No Known Allergies [2]  No current outpatient medications on file prior to visit.   No current facility-administered medications on file prior to visit.   "

## 2024-02-18 NOTE — Patient Instructions (Signed)
-   Obtain fasting labs with orders provided (can have water or black coffee but otherwise no food or drink x 8 hours before labs) - Review information provided - Attend eye doctor annually, dentist every 6 months, work towards or maintain 30 minutes of moderate intensity physical activity at least 5 days per week, and consume a balanced diet - Return in 1 year for physical - Contact us  for any questions between now and then   VISIT SUMMARY:  You came in for a physical and we discussed your weight management. We also reviewed your prediabetes status.  YOUR PLAN:  WEIGHT MANAGEMENT -We have prescribed Wegovy  with monthly dose titration, and [facility]. -We will follow up in three months to assess your weight management progress. -Weight loss is expected after 3-4 months of treatment. -We discussed managing side effects, and ondansetron was offered for nausea. -I will send you a MyChart message with information on managing constipation. -Please monitor for any side effects and report any concerns. -Baseline labs have been ordered before restarting the medication.  PREDIABETES: You have prediabetes with a hemoglobin A1c of 5.8%. -Labs have been ordered to monitor your glycemic control and assess progression. -We discussed the importance of monitoring for hyperglycemia and the risk of urinary tract infections. -Please report any new urinary symptoms or health changes.

## 2024-02-19 ENCOUNTER — Ambulatory Visit: Payer: Self-pay | Admitting: Family Medicine

## 2024-02-19 LAB — COMPREHENSIVE METABOLIC PANEL WITH GFR
ALT: 25 IU/L (ref 0–44)
AST: 17 IU/L (ref 0–40)
Albumin: 4 g/dL — ABNORMAL LOW (ref 4.1–5.1)
Alkaline Phosphatase: 73 IU/L (ref 47–123)
BUN/Creatinine Ratio: 16 (ref 9–20)
BUN: 15 mg/dL (ref 6–20)
Bilirubin Total: 0.3 mg/dL (ref 0.0–1.2)
CO2: 22 mmol/L (ref 20–29)
Calcium: 8.9 mg/dL (ref 8.7–10.2)
Chloride: 103 mmol/L (ref 96–106)
Creatinine, Ser: 0.91 mg/dL (ref 0.76–1.27)
Globulin, Total: 2.7 g/dL (ref 1.5–4.5)
Glucose: 88 mg/dL (ref 70–99)
Potassium: 4.6 mmol/L (ref 3.5–5.2)
Sodium: 138 mmol/L (ref 134–144)
Total Protein: 6.7 g/dL (ref 6.0–8.5)
eGFR: 110 mL/min/1.73

## 2024-02-19 LAB — CBC
Hematocrit: 45.1 % (ref 37.5–51.0)
Hemoglobin: 14.5 g/dL (ref 13.0–17.7)
MCH: 27.9 pg (ref 26.6–33.0)
MCHC: 32.2 g/dL (ref 31.5–35.7)
MCV: 87 fL (ref 79–97)
Platelets: 228 x10E3/uL (ref 150–450)
RBC: 5.2 x10E6/uL (ref 4.14–5.80)
RDW: 13.7 % (ref 11.6–15.4)
WBC: 7.8 x10E3/uL (ref 3.4–10.8)

## 2024-02-19 LAB — LIPID PANEL
Chol/HDL Ratio: 5 ratio (ref 0.0–5.0)
Cholesterol, Total: 190 mg/dL (ref 100–199)
HDL: 38 mg/dL — ABNORMAL LOW
LDL Chol Calc (NIH): 120 mg/dL — ABNORMAL HIGH (ref 0–99)
Triglycerides: 181 mg/dL — ABNORMAL HIGH (ref 0–149)
VLDL Cholesterol Cal: 32 mg/dL (ref 5–40)

## 2024-02-19 LAB — HEMOGLOBIN A1C
Est. average glucose Bld gHb Est-mCnc: 114 mg/dL
Hgb A1c MFr Bld: 5.6 % (ref 4.8–5.6)

## 2024-03-04 ENCOUNTER — Other Ambulatory Visit (HOSPITAL_COMMUNITY): Payer: Self-pay

## 2024-03-04 ENCOUNTER — Telehealth: Payer: Self-pay | Admitting: Pharmacy Technician

## 2024-03-04 NOTE — Telephone Encounter (Signed)
 Pharmacy Patient Advocate Encounter   Received notification from Patient Advice Request messages that prior authorization for Wegovy  0.25MG /0.5ML auto-injectors is required/requested.   Insurance verification completed.   The patient is insured through [facility].   Per test claim: PA required; PA started via CoverMyMeds. KEY [id number] . Waiting for clinical questions to populate.

## 2024-03-04 NOTE — Telephone Encounter (Signed)
 PA request has been Started. New Encounter has been or will be created for follow up. For additional info see Pharmacy Prior Auth telephone encounter from 03/04/24.

## 2024-03-04 NOTE — Telephone Encounter (Signed)
 Please submit PA for Wegovy . Thank you.  JM

## 2024-03-11 ENCOUNTER — Other Ambulatory Visit (HOSPITAL_COMMUNITY): Payer: Self-pay

## 2024-03-12 ENCOUNTER — Other Ambulatory Visit (HOSPITAL_COMMUNITY): Payer: Self-pay

## 2024-03-13 ENCOUNTER — Other Ambulatory Visit (HOSPITAL_COMMUNITY): Payer: Self-pay

## 2024-03-13 NOTE — Telephone Encounter (Signed)
 Pharmacy Patient Advocate Encounter  Received notification from [facility] that Prior Authorization for Wegovy  0.25MG /0.5ML auto-injectors has been APPROVED from 03/13/24 to 08/31/24. Ran test claim, Copay is $15.00. This test claim was processed through [facility]- copay amounts may vary at other pharmacies due to pharmacy/plan contracts, or as the patient moves through the different stages of their insurance plan.   PA #/Case ID/Reference #: [id number]

## 2024-03-13 NOTE — Telephone Encounter (Signed)
 Still pending as of 03/13/2024

## 2024-05-16 ENCOUNTER — Ambulatory Visit: Admitting: Family Medicine
